# Patient Record
Sex: Male | Born: 1955 | Race: White | Hispanic: No | Marital: Single | Smoking: Former smoker
Health system: Southern US, Community
[De-identification: ages and names within clinical notes are randomized; demographics above are authoritative.]

## PROBLEM LIST (undated history)

## (undated) DIAGNOSIS — K648 Other hemorrhoids: Secondary | ICD-10-CM

## (undated) DIAGNOSIS — Z86010 Personal history of colonic polyps: Secondary | ICD-10-CM

## (undated) DIAGNOSIS — K219 Gastro-esophageal reflux disease without esophagitis: Secondary | ICD-10-CM

## (undated) DIAGNOSIS — I10 Essential (primary) hypertension: Secondary | ICD-10-CM

## (undated) DIAGNOSIS — N451 Epididymitis: Secondary | ICD-10-CM

## (undated) HISTORY — DX: Other hemorrhoids: K64.8

## (undated) HISTORY — DX: Personal history of colonic polyps: Z86.010

## (undated) HISTORY — DX: Gastro-esophageal reflux disease without esophagitis: K21.9

## (undated) HISTORY — PX: POLYPECTOMY: SHX149

## (undated) HISTORY — DX: Epididymitis: N45.1

## (undated) HISTORY — DX: Essential (primary) hypertension: I10

## (undated) HISTORY — PX: COLONOSCOPY: SHX174

---

## 2007-03-12 ENCOUNTER — Emergency Department (HOSPITAL_COMMUNITY): Admission: EM | Admit: 2007-03-12 | Discharge: 2007-03-12 | Payer: Self-pay | Admitting: Emergency Medicine

## 2008-07-21 ENCOUNTER — Emergency Department (HOSPITAL_COMMUNITY): Admission: EM | Admit: 2008-07-21 | Discharge: 2008-07-21 | Payer: Self-pay | Admitting: Emergency Medicine

## 2008-09-09 ENCOUNTER — Emergency Department (HOSPITAL_COMMUNITY): Admission: EM | Admit: 2008-09-09 | Discharge: 2008-09-09 | Payer: Self-pay | Admitting: Emergency Medicine

## 2008-10-23 ENCOUNTER — Ambulatory Visit (HOSPITAL_COMMUNITY): Admission: RE | Admit: 2008-10-23 | Discharge: 2008-10-23 | Payer: Self-pay | Admitting: *Deleted

## 2009-05-26 ENCOUNTER — Emergency Department (HOSPITAL_COMMUNITY): Admission: EM | Admit: 2009-05-26 | Discharge: 2009-05-26 | Payer: Self-pay | Admitting: Family Medicine

## 2011-03-24 LAB — POCT URINALYSIS DIP (DEVICE)
Glucose, UA: NEGATIVE mg/dL
Ketones, ur: NEGATIVE mg/dL
Specific Gravity, Urine: 1.015 (ref 1.005–1.030)
Urobilinogen, UA: 0.2 mg/dL (ref 0.0–1.0)

## 2011-04-29 NOTE — Op Note (Signed)
Dylan Barton, DUERSON NO.:  [id number]   MEDICAL RECORD NO.:  [id number]          PATIENT TYPE:  AMB   LOCATION:  ENDO                         FACILITY:  [facility]   PHYSICIAN:  Georgiana Spinner, M.D.    DATE OF BIRTH:  07-Nov-1956   DATE OF PROCEDURE:  DATE OF DISCHARGE:                               OPERATIVE REPORT   PROCEDURE:  Colonoscopy.   INDICATIONS:  Colon cancer screening.   ANESTHESIA:  Fentanyl 100 mcg, Versed 10 mg.   PROCEDURE:  With the patient mildly sedated in the left lateral  decubitus position, a rectal exam was performed which was unremarkable.  Subsequently the Pentax videoscopic colonoscope was inserted in the  rectum and passed under direct vision to the cecum, identified by  ileocecal valve and appendiceal orifice, both of which were  photographed.  From this point the colonoscope was slowly withdrawn  taking circumferential views of colonic mucosa stopping in the rectum,  which appeared normal on direct and retroflexed views.  The endoscope  was straightened and withdrawn.  The patient's vital signs and pulse  oximeter remained stable.  The patient tolerated the procedure well  without apparent complications.   FINDINGS:  Negative examination.   PLAN:  Consider repeat examination in 5-10 years.           ______________________________  Georgiana Spinner, M.D.     GMO/MEDQ  D:  10/23/2008  T:  10/23/2008  Job:  [id number]

## 2011-09-15 LAB — POCT URINALYSIS DIP (DEVICE)
Glucose, UA: NEGATIVE
Ketones, ur: NEGATIVE
Operator id: 270961
Specific Gravity, Urine: 1.025
Urobilinogen, UA: 0.2

## 2012-08-12 ENCOUNTER — Encounter (HOSPITAL_COMMUNITY): Payer: Self-pay | Admitting: Emergency Medicine

## 2012-08-12 ENCOUNTER — Emergency Department (HOSPITAL_COMMUNITY)
Admission: EM | Admit: 2012-08-12 | Discharge: 2012-08-12 | Disposition: A | Discharge status: Home / Self Care | Payer: BC Managed Care – PPO | Source: Home / Self Care | Attending: Family Medicine | Admitting: Family Medicine

## 2012-08-12 DIAGNOSIS — S0501XA Injury of conjunctiva and corneal abrasion without foreign body, right eye, initial encounter: Secondary | ICD-10-CM

## 2012-08-12 DIAGNOSIS — H5710 Ocular pain, unspecified eye: Secondary | ICD-10-CM

## 2012-08-12 DIAGNOSIS — H5711 Ocular pain, right eye: Secondary | ICD-10-CM

## 2012-08-12 DIAGNOSIS — S058X9A Other injuries of unspecified eye and orbit, initial encounter: Secondary | ICD-10-CM

## 2012-08-12 MED ORDER — TETRACAINE HCL 0.5 % OP SOLN
OPHTHALMIC | Status: AC
  Filled 2012-08-12: qty 2

## 2012-08-12 MED ORDER — POLYMYXIN B-TRIMETHOPRIM 10000-0.1 UNIT/ML-% OP SOLN: 1.0000 [drp] | OPHTHALMIC | Status: AC

## 2012-08-12 NOTE — ED Provider Notes (Signed)
History     CSN: [id number]  Arrival date & time 08/12/12  1654   First MD Initiated Contact with Patient 08/12/12 1732      Chief Complaint  Patient presents with  . Foreign Body in Eye    (Consider location/radiation/quality/duration/timing/severity/associated sxs/prior treatment) Patient is a 56 y.o. male presenting with foreign body in eye. The history is provided by the patient.  Foreign Body in Eye  Patient reports right eye graining feeling in for two days, states not sure if something flew in his eye since he was standing near a fan.  States has used artificial tears to flush with no improvement in symptoms.  Denies previous history of same.  Not diabetic, no previous eye surgery/trauma, denies cataracts or glaucoma. No floaters or flashing lights No vision loss No blurred vision + eye irritation No eyelid itching No tearing No headache/scalp tenderness Does not wear contacts or glasses.  History reviewed. No pertinent past medical history.  History reviewed. No pertinent past surgical history.  No family history on file.  History  Substance Use Topics  . Smoking status: Never Smoker   . Smokeless tobacco: Not on file  . Alcohol Use: Yes      Review of Systems  All other systems reviewed and are negative.    Allergies  Review of patient's allergies indicates no known allergies.  Home Medications   Current Outpatient Rx  Name Route Sig Dispense Refill  . POLYMYXIN B-TRIMETHOPRIM 10000-0.1 UNIT/ML-% OP SOLN Right Eye Place 1 drop into the right eye every 4 (four) hours. 10 mL 0    BP 166/88  Pulse 84  Temp 98.5 F (36.9 C) (Oral)  Resp 17  SpO2 98%  Physical Exam  Nursing note and vitals reviewed. Constitutional: He is oriented to person, place, and time. Vital signs are normal. He appears well-developed and well-nourished. He is active and cooperative.  HENT:  Head: Normocephalic.  Right Ear: Hearing, tympanic membrane, external ear and ear  canal normal.  Left Ear: Hearing, tympanic membrane, external ear and ear canal normal.  Nose: Nose normal. Right sinus exhibits no maxillary sinus tenderness and no frontal sinus tenderness. Left sinus exhibits no maxillary sinus tenderness and no frontal sinus tenderness.  Mouth/Throat: Uvula is midline, oropharynx is clear and moist and mucous membranes are normal.  Eyes: Conjunctivae, EOM and lids are normal. Pupils are equal, round, and reactive to light. No foreign bodies found. Right eye exhibits no chemosis, no discharge, no exudate and no hordeolum. No foreign body present in the right eye. Left eye exhibits no chemosis, no discharge, no exudate and no hordeolum. No foreign body present in the left eye. No scleral icterus.         Tetracaine and fluoroscien applied to right eye, examined with woods lamp.  right corneal abrasion noted, no foreign body found.  Pt tolerated procedure well.  Neck: Trachea normal. Neck supple.  Cardiovascular: Normal rate and regular rhythm.   Pulmonary/Chest: Effort normal and breath sounds normal.  Lymphadenopathy:    He has no cervical adenopathy.  Neurological: He is alert and oriented to person, place, and time. No cranial nerve deficit or sensory deficit.  Skin: Skin is warm and dry.  Psychiatric: He has a normal mood and affect. His speech is normal and behavior is normal. Judgment and thought content normal. Cognition and memory are normal.    ED Course  Procedures (including critical care time)  Labs Reviewed - No data to display No results  found.   1. Right corneal abrasion   2. Pain in right eye       MDM  Polytrim as ordered.  Apply warm compresses four times daily. Seek follow-up care with an ophthalmologist within 3-4 days to ensure resolution.           Johnsie Kindred, NP 08/12/12 2008

## 2012-08-12 NOTE — ED Notes (Addendum)
Pt here with right eye irritation and little swelling after poss object in eye.states after working on fan something may have flew in eye.eye drops used but denies blurred vision or dizziness.sx started 2 dys ago

## 2012-08-15 NOTE — ED Provider Notes (Signed)
Medical screening examination/treatment/procedure(s) were performed by resident physician or non-physician practitioner and as supervising physician I was immediately available for consultation/collaboration.   Barkley Bruns MD.    Linna Hoff, MD 08/15/12 [phone]

## 2013-08-29 ENCOUNTER — Emergency Department (HOSPITAL_COMMUNITY)
Admission: EM | Admit: 2013-08-29 | Discharge: 2013-08-29 | Disposition: A | Discharge status: Home / Self Care | Payer: BC Managed Care – PPO | Source: Home / Self Care | Attending: Family Medicine | Admitting: Family Medicine

## 2013-08-29 ENCOUNTER — Encounter (HOSPITAL_COMMUNITY): Payer: Self-pay

## 2013-08-29 DIAGNOSIS — IMO0002 Reserved for concepts with insufficient information to code with codable children: Secondary | ICD-10-CM

## 2013-08-29 DIAGNOSIS — S41109A Unspecified open wound of unspecified upper arm, initial encounter: Secondary | ICD-10-CM

## 2013-08-29 DIAGNOSIS — Z23 Encounter for immunization: Secondary | ICD-10-CM

## 2013-08-29 MED ORDER — TETANUS-DIPHTH-ACELL PERTUSSIS 5-2.5-18.5 LF-MCG/0.5 IM SUSP
0.5000 mL | Freq: Once | INTRAMUSCULAR | Status: AC
  Administered 2013-08-29: 0.5 mL via INTRAMUSCULAR

## 2013-08-29 MED ORDER — TETANUS-DIPHTH-ACELL PERTUSSIS 5-2.5-18.5 LF-MCG/0.5 IM SUSP
INTRAMUSCULAR | Status: AC
  Filled 2013-08-29: qty 0.5

## 2013-08-29 NOTE — ED Provider Notes (Signed)
CSN: [id number]     Arrival date & time 08/29/13  1156 History   None    Chief Complaint  Patient presents with  . 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 Wasn't concerned about wound, but came in to get a new tetanus shot.   Patient is a 57 y.o. male presenting with skin laceration. The history is provided by the patient.  Laceration Location:  Shoulder/arm Shoulder/arm laceration location:  L elbow Depth:  Through dermis Quality: avulsion   Time since incident:  13 hours Laceration mechanism:  Metal edge Pain details:    Quality:  Aching   Severity:  Mild   Progression:  Improving Foreign body present:  No foreign bodies Relieved by:  Pressure Worsened by:  Nothing tried Ineffective treatments:  None tried Tetanus status:  Out of date   History reviewed. No pertinent past medical history. History reviewed. No pertinent past surgical history. History reviewed. No pertinent family history. History  Substance Use Topics  . Smoking status: Never Smoker   . Smokeless tobacco: Not on file  . Alcohol Use: Yes    Review of Systems  Constitutional: Negative for fever and chills.  Skin: Positive for wound.    Allergies  Review of patient's allergies indicates no known allergies.  Home Medications  No current outpatient prescriptions on file. BP 164/87  Pulse 85  Temp(Src) 98 F (36.7 C) (Oral)  Resp 18  SpO2 100% Physical Exam  Constitutional: He appears well-developed and well-nourished. No distress.  Skin: Skin is warm and dry. Laceration noted.       ED Course  Procedures (including critical care time) Labs Review Labs Reviewed - No data to display Imaging Review No results found.  MDM   1. Laceration of elbow or forearm or wrist    Tetanus updated.     Cathlyn Parsons, NP 08/29/13 1348

## 2013-08-29 NOTE — ED Notes (Signed)
C/o he cut his left elbow on light fixture around midnight last night on job (self employed) V shaped flap laceration, no bleeding at present, NAD

## 2013-08-29 NOTE — Discharge Instructions (Signed)
Use antibiotic ointment on wound and keep clean and covered until healed.  The tape strips will fall off in about 4-5 days.

## 2013-08-30 NOTE — ED Provider Notes (Signed)
Medical screening examination/treatment/procedure(s) were performed by a resident physician or non-physician practitioner and as the supervising physician I was immediately available for consultation/collaboration.  Jaran Sainz, MD   Hassel Uphoff S Laverle Pillard, MD 08/30/13 1338 

## 2014-04-04 ENCOUNTER — Encounter: Payer: Self-pay | Admitting: Internal Medicine

## 2014-06-05 ENCOUNTER — Encounter: Payer: Self-pay | Admitting: Internal Medicine

## 2014-06-05 ENCOUNTER — Ambulatory Visit (INDEPENDENT_AMBULATORY_CARE_PROVIDER_SITE_OTHER): Payer: BC Managed Care – PPO | Admitting: Internal Medicine

## 2014-06-05 VITALS — BP 150/78 | HR 76 | Ht 73.0 in | Wt 161.0 lb

## 2014-06-05 DIAGNOSIS — N509 Disorder of male genital organs, unspecified: Secondary | ICD-10-CM

## 2014-06-05 DIAGNOSIS — Z1211 Encounter for screening for malignant neoplasm of colon: Secondary | ICD-10-CM

## 2014-06-05 DIAGNOSIS — K648 Other hemorrhoids: Secondary | ICD-10-CM

## 2014-06-05 DIAGNOSIS — N50811 Right testicular pain: Secondary | ICD-10-CM

## 2014-06-05 DIAGNOSIS — Z800 Family history of malignant neoplasm of digestive organs: Secondary | ICD-10-CM | POA: Insufficient documentation

## 2014-06-05 HISTORY — DX: Other hemorrhoids: K64.8

## 2014-06-05 NOTE — Patient Instructions (Signed)
You have been scheduled for a colonoscopy. Please follow written instructions given to you at your visit today.  Please pick up your prep kit at the pharmacy within the next 1-3 days. If you use inhalers (even only as needed), please bring them with you on the day of your procedure. Your physician has requested that you go to www.startemmi.com and enter the access code given to you at your visit today. This web site gives a general overview about your procedure. However, you should still follow specific instructions given to you by our office regarding your preparation for the procedure.  Today we have given you a handout to read on hemorrhoids.  Please go and see Dr. Shelia Media regarding your testicular issues.   I appreciate the opportunity to care for you.

## 2014-06-05 NOTE — Progress Notes (Signed)
         Subjective:    Patient ID: Dylan Barton, male    DOB: 02/06/1956, 58 y.o.   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 He had that resected. The patient had a colonoscopy in 2009 by Dr. Lajoyce Corners, it was normal.  Time in the past few months he had an episode of bright red blood in the toilet bowl. It was after a long drive. He travels a lot for work. Hemoglobin was normal at 13.7.  He briefly used Anusol HC cream. He has not had recurrent bleeding or any rectal or anal symptoms. He has rare heartburn says he is very flatulent but otherwise unremarkable GI review of systems. No Known Allergies No outpatient prescriptions prior to visit.   No facility-administered medications prior to visit.   Past Medical History  Diagnosis Date  . Epididymitis    Past Surgical History  Procedure Laterality Date  . Colonoscopy     History   Social History  . Marital Status: Single   Occupational History  . Electrician    Social History Main Topics  . Smoking status: Former Research scientist (life sciences)  . Smokeless tobacco: Never Used  . Alcohol Use: Yes  . Drug Use: No   Social History Narrative   Single, no children   He works as an Clinical biochemist traveling to [employer]    One alcoholic drink per day 2 caffeinated beverages daily   Family History  Problem Relation Age of Onset  . Colon cancer Brother   . Colon polyps Brother   . CVA Mother 61        Review of Systems The burning pain in the right testicle without any change in sexual function, urination. No mass.    Objective:   Physical Exam General:  Well-developed, well-nourished and in no acute distress Eyes:  anicteric. ENT:   Mouth and posterior pharynx free of lesions.  Neck:   supple w/o thyromegaly or mass.  Lungs: Clear to auscultation bilaterally. Heart:  S1S2, no rubs, murmurs, gallops. Abdomen:  soft,  non-tender, no hepatosplenomegaly, hernia, or mass and BS+.  Rectal: Normal anoderm, no mass, prostate ok, brown stool GU:  Normal circumcised penis, bilateral descended testes that are normal, there are no hernias, there is no tenderness no scrotal change. Lymph:  no cervical or supraclavicular adenopathy. Extremities:   no edema Skin   no rash. Neuro:  A&O x 3.  Psych:  appropriate mood and  Affect.   Anoscopy was performed with the patient in the left lateral decubitus position  and revealed Grade 1 internal hemorrhoids all 3 positions.  Data Reviewed:  Prior colonoscopy 2009 NL CBC 04/03/2014 PCP note 04/03/2014    Assessment & Plan:   1. Hemorrhoids, internal, with bleeding  Handout provided one-time occurrence, no other intervention needed at this time   2. Colon cancer screening   3. Family history of colon cancer - brother 57   Will schedule screening colonoscopy.  The risks and benefits as well as alternatives of endoscopic procedure(s) have been discussed and reviewed. All questions answered. The patient agrees to proceed.   4. Right testicular pain   I find no abnormality to correlate. He will followup with Dr. Shelia Media.   I appreciate the opportunity to care for this patient.  CC: Horatio Pel, MD

## 2014-06-09 ENCOUNTER — Encounter: Payer: Self-pay | Admitting: Internal Medicine

## 2014-08-07 ENCOUNTER — Ambulatory Visit (AMBULATORY_SURGERY_CENTER): Payer: BC Managed Care – PPO | Admitting: Internal Medicine

## 2014-08-07 ENCOUNTER — Encounter: Payer: Self-pay | Admitting: Internal Medicine

## 2014-08-07 VITALS — BP 125/65 | HR 67 | Temp 98.9°F | Resp 18 | Ht 73.0 in | Wt 161.0 lb

## 2014-08-07 DIAGNOSIS — Z860100 Personal history of colon polyps, unspecified: Secondary | ICD-10-CM

## 2014-08-07 DIAGNOSIS — Z800 Family history of malignant neoplasm of digestive organs: Secondary | ICD-10-CM

## 2014-08-07 DIAGNOSIS — D126 Benign neoplasm of colon, unspecified: Secondary | ICD-10-CM

## 2014-08-07 DIAGNOSIS — Z1211 Encounter for screening for malignant neoplasm of colon: Secondary | ICD-10-CM

## 2014-08-07 HISTORY — DX: Personal history of colon polyps, unspecified: Z86.0100

## 2014-08-07 HISTORY — DX: Personal history of colonic polyps: Z86.010

## 2014-08-07 MED ORDER — SODIUM CHLORIDE 0.9 % IV SOLN: 500.0000 mL | INTRAVENOUS | Status: DC

## 2014-08-07 NOTE — Progress Notes (Signed)
No problems noted in the recovery room. maw 

## 2014-08-07 NOTE — Progress Notes (Signed)
Called to room to assist during endoscopic procedure.  Patient ID and intended procedure confirmed with present staff. Received instructions for my participation in the procedure from the performing physician.  

## 2014-08-07 NOTE — Progress Notes (Signed)
A/ox3 pleased with MAC, report to Annette RN 

## 2014-08-07 NOTE — Op Note (Signed)
[place]  [address] [place], [address]   COLONOSCOPY PROCEDURE REPORT  PATIENT: Dylan, Barton  MR#: [id number] BIRTHDATE: July 28, 1956 , 89  yrs. old GENDER: Male ENDOSCOPIST: Gatha Mayer, MD, [facility] REFERRED AO:ZHYQMV Delight Hoh, M.D. PROCEDURE DATE:  08/07/2014 PROCEDURE:   Colonoscopy with biopsy and snare polypectomy and Submucosal injection, any substance First Screening Colonoscopy - Avg.  risk and is 50 yrs.  old or older - No.  Prior Negative Screening - Now for repeat screening. Above average risk  History of Adenoma - Now for follow-up colonoscopy & has been > or = to 3 yrs.  N/A  Polyps Removed Today? Yes. ASA CLASS:   Class I INDICATIONS:Patient's immediate family history of colon cancer (brother - 45), elevated risk screening, and Last colonoscopy performed 5 years ago. MEDICATIONS: propofol (Diprivan) 350mg  IV, MAC sedation, administered by CRNA, and These medications were titrated to patient response per physician's verbal order  DESCRIPTION OF PROCEDURE:   After the risks benefits and alternatives of the procedure were thoroughly explained, informed consent was obtained.  A digital rectal exam revealed no abnormalities of the rectum, A digital rectal exam revealed the prostate was not enlarged, and A digital rectal exam revealed no prostatic nodules.   The LB [id number]-H190 [id number]  endoscope was introduced through the anus and advanced to the cecum, which was identified by both the appendix and ileocecal valve. No adverse events experienced.   The quality of the prep was good, using MiraLax  The instrument was then slowly withdrawn as the colon was fully examined.      COLON FINDINGS: A polypoid shaped flat polyp measuring 12-15 mm in size was found at the cecum.  Endoscopic mucosal resection was performed in a piecemeal fashion by injecting saline into the submucosa to raise the lesion and polypectomy was performed with snare cautery.  The  polyp lifted well after submucosal injection. The resection was complete and the polyp tissue was completely retrieved.  There was minimal blood loss from the polypectomy which ceased spontaneously. 1 clip placed to reduce risk future bleeding. Three diminutive sessile polyps were found in the ascending colon, transverse colon, and at the splenic flexure.  A polypectomy was performed with cold forceps and with a cold snare.  The resection was complete and the polyp tissue was partially retrieved.  Splenic flexure polyp not retrieved.  The colon mucosa was otherwise normal.   A right colon retroflexion was performed.  Retroflexed views revealed internal hemorrhoids. The time to cecum=3 minutes 38 seconds.  Withdrawal time=20 minutes 07 seconds.  The scope was withdrawn and the procedure completed. COMPLICATIONS: There were no complications.  ENDOSCOPIC IMPRESSION: 1.   Flat polyp measuring 12-15 mm in size was found at the cecum; endoscopic mucosal resection was performed 2.   Three diminutive sessile polyps were found in the ascending colon, transverse colon, and at the splenic flexure; polypectomy was performed with cold forceps and with a cold snare 3.   The colon mucosa was otherwise normal - good prep  RECOMMENDATIONS: 1.  Timing of repeat colonoscopy will be determined by pathology findings. 2.  Hold aspirin, aspirin products, and anti-inflammatory medication for 2 weeks.   eSigned:  Gatha Mayer, MD, [facility] 08/07/2014 2:46 PM   cc: Alden Server, MD and The Patient   PATIENT NAME:  Dylan, Barton MR#: [id number]

## 2014-08-07 NOTE — Patient Instructions (Addendum)
I found and removed 4 polyps - one of them could not be picked up for analysis.  The largest polyp was flat and may need close follow-up in 6-12 months to make sure its gone.  I will let you know pathology results and when to have another routine colonoscopy by mail.  I appreciate the opportunity to care for you. Gatha Mayer, MD, FACG    YOU HAD AN ENDOSCOPIC PROCEDURE TODAY AT [facility]: Refer to the procedure report that was given to you for any specific questions about what was found during the examination.  If the procedure report does not answer your questions, please call your gastroenterologist to clarify.  If you requested that your care partner not be given the details of your procedure findings, then the procedure report has been included in a sealed envelope for you to review at your convenience later.  YOU SHOULD EXPECT: Some feelings of bloating in the abdomen. Passage of more gas than usual.  Walking can help get rid of the air that was put into your GI tract during the procedure and reduce the bloating. If you had a lower endoscopy (such as a colonoscopy or flexible sigmoidoscopy) you may notice spotting of blood in your stool or on the toilet paper. If you underwent a bowel prep for your procedure, then you may not have a normal bowel movement for a few days.  DIET: Your first meal following the procedure should be a light meal and then it is ok to progress to your normal diet.  A half-sandwich or bowl of soup is an example of a good first meal.  Heavy or fried foods are harder to digest and may make you feel nauseous or bloated.  Likewise meals heavy in dairy and vegetables can cause extra gas to form and this can also increase the bloating.  Drink plenty of fluids but you should avoid alcoholic beverages for 24 hours.  ACTIVITY: Your care partner should take you home directly after the procedure.  You should plan to take it easy, moving slowly for the rest of  the day.  You can resume normal activity the day after the procedure however you should NOT DRIVE or use heavy machinery for 24 hours (because of the sedation medicines used during the test).    SYMPTOMS TO REPORT IMMEDIATELY: A gastroenterologist can be reached at any hour.  During normal business hours, 8:30 AM to 5:00 PM Monday through Friday, call [phone].  After hours and on weekends, please call the GI answering service at [phone] who will take a message and have the physician on call contact you.   Following lower endoscopy (colonoscopy or flexible sigmoidoscopy):  Excessive amounts of blood in the stool  Significant tenderness or worsening of abdominal pains  Swelling of the abdomen that is new, acute  Fever of 100F or higher FOLLOW UP: If any biopsies were taken you will be contacted by phone or by letter within the next 1-3 weeks.  Call your gastroenterologist if you have not heard about the biopsies in 3 weeks.  Our staff will call the home number listed on your records the next business day following your procedure to check on you and address any questions or concerns that you may have at that time regarding the information given to you following your procedure. This is a courtesy call and so if there is no answer at the home number and we have not heard from you through  the emergency physician on call, we will assume that you have returned to your regular daily activities without incident.  SIGNATURES/CONFIDENTIALITY: You and/or your care partner have signed paperwork which will be entered into your electronic medical record.  These signatures attest to the fact that that the information above on your After Visit Summary has been reviewed and is understood.  Full responsibility of the confidentiality of this discharge information lies with you and/or your care-partner.     Handouts were given to your care partner on polyps and hemorrhoids. Hold aspirin, aspirin  products, and any anti-inflammatory medications for two weeks. You may resume your other current medications today. Await biopsy results. Please call if any questions or concerns. A clip was placed in the cecum to aid in stopping bleeding.  Card given to your care partner about clip placement.

## 2014-08-08 ENCOUNTER — Telehealth: Payer: Self-pay | Admitting: *Deleted

## 2014-08-08 NOTE — Telephone Encounter (Signed)
  Follow up Call-  Call back number 08/07/2014  Post procedure Call Back phone  # [phone]  Permission to leave phone message Yes     Patient questions:  Do you have a fever, pain , or abdominal swelling? No. Pain Score  0 *  Have you tolerated food without any problems? Yes.    Have you been able to return to your normal activities? Yes.    Do you have any questions about your discharge instructions: Diet   No. Medications  No. Follow up visit  No.  Do you have questions or concerns about your Care? Yes.    Actions: * If pain score is 4 or above: No action needed, pain <4.

## 2014-08-14 ENCOUNTER — Encounter: Payer: Self-pay | Admitting: Internal Medicine

## 2014-08-14 NOTE — Progress Notes (Signed)
Quick Note:  3 adenomas max 15 mm Repeat colonoscopy 2018 ______

## 2017-09-02 ENCOUNTER — Encounter: Payer: Self-pay | Admitting: Internal Medicine

## 2017-09-28 ENCOUNTER — Encounter: Payer: Self-pay | Admitting: Internal Medicine

## 2017-10-28 ENCOUNTER — Other Ambulatory Visit: Payer: Self-pay

## 2017-10-28 ENCOUNTER — Ambulatory Visit (AMBULATORY_SURGERY_CENTER): Payer: Self-pay | Admitting: *Deleted

## 2017-10-28 VITALS — Ht 73.0 in | Wt 167.2 lb

## 2017-10-28 DIAGNOSIS — Z86010 Personal history of colonic polyps: Secondary | ICD-10-CM

## 2017-10-28 NOTE — Progress Notes (Signed)
No allergies to eggs or soy. No prior anesthesia; no problems with sedation with last colonoscopy  Pt given Emmi instructions for colonoscopy  No oxygen use  No diet drug use

## 2017-10-29 ENCOUNTER — Encounter: Payer: Self-pay | Admitting: Internal Medicine

## 2017-11-11 ENCOUNTER — Ambulatory Visit (AMBULATORY_SURGERY_CENTER): Payer: BLUE CROSS/BLUE SHIELD | Admitting: Internal Medicine

## 2017-11-11 ENCOUNTER — Encounter: Payer: Self-pay | Admitting: Internal Medicine

## 2017-11-11 ENCOUNTER — Other Ambulatory Visit: Payer: Self-pay

## 2017-11-11 VITALS — BP 135/84 | HR 62 | Temp 97.5°F | Resp 39 | Ht 73.0 in | Wt 167.0 lb

## 2017-11-11 DIAGNOSIS — Z800 Family history of malignant neoplasm of digestive organs: Secondary | ICD-10-CM

## 2017-11-11 DIAGNOSIS — Z86010 Personal history of colonic polyps: Secondary | ICD-10-CM | POA: Diagnosis present

## 2017-11-11 DIAGNOSIS — D129 Benign neoplasm of anus and anal canal: Secondary | ICD-10-CM

## 2017-11-11 DIAGNOSIS — D120 Benign neoplasm of cecum: Secondary | ICD-10-CM

## 2017-11-11 DIAGNOSIS — D124 Benign neoplasm of descending colon: Secondary | ICD-10-CM

## 2017-11-11 DIAGNOSIS — D128 Benign neoplasm of rectum: Secondary | ICD-10-CM | POA: Diagnosis not present

## 2017-11-11 MED ORDER — SODIUM CHLORIDE 0.9 % IV SOLN: 500.0000 mL | INTRAVENOUS | Status: DC

## 2017-11-11 NOTE — Progress Notes (Signed)
Called to room to assist during endoscopic procedure.  Patient ID and intended procedure confirmed with present staff. Received instructions for my participation in the procedure from the performing physician.Called to room to assist during endoscopic procedure.   

## 2017-11-11 NOTE — Op Note (Signed)
Dylan Barton: Dylan Barton Procedure Date: 11/11/2017 12:08 PM MRN: [id number] Endoscopist: Gatha Mayer , MD Age: 61 Referring MD:  Date of Birth: [birth year] Gender: Male Account #: [id number] Procedure:                Colonoscopy Indications:              Surveillance: Personal history of adenomatous                            polyps on last colonoscopy 3 years ago, Family                            history of colon cancer in a first-degree relative Medicines:                Propofol per Anesthesia, Monitored Anesthesia Care Procedure:                Pre-Anesthesia Assessment:                           - Prior to the procedure, a History and Physical                            was performed, and patient medications and                            allergies were reviewed. The patient's tolerance of                            previous anesthesia was also reviewed. The risks                            and benefits of the procedure and the sedation                            options and risks were discussed with the patient.                            All questions were answered, and informed consent                            was obtained. Prior Anticoagulants: The patient has                            taken no previous anticoagulant or antiplatelet                            agents. ASA Grade Assessment: I - A normal, healthy                            patient. After reviewing the risks and benefits,                            the patient was deemed in satisfactory condition to  undergo the procedure.                           After obtaining informed consent, the colonoscope                            was passed under direct vision. Throughout the                            procedure, the patient's blood pressure, pulse, and                            oxygen saturations were monitored continuously. The   Colonoscope was introduced through the anus and                            advanced to the the cecum, identified by                            appendiceal orifice and ileocecal valve. The                            colonoscopy was performed without difficulty. The                            patient tolerated the procedure well. The quality                            of the bowel preparation was good. The bowel                            preparation used was Miralax. The ileocecal valve,                            appendiceal orifice, and rectum were photographed. Scope In: 12:20:22 PM Scope Out: 12:40:09 PM Scope Withdrawal Time: 0 hours 17 minutes 11 seconds  Total Procedure Duration: 0 hours 19 minutes 47 seconds  Findings:                 The perianal and digital rectal examinations were                            normal. Pertinent negatives include normal prostate                            (size, shape, and consistency).                           A 8 mm polyp was found in the cecum. The polyp was                            sessile. Arising near/from polypectomy scar.The                            polyp was removed with a hot  snare. Resection and                            retrieval were complete. Verification of patient                            identification for the specimen was done. Estimated                            blood loss was minimal.                           Two sessile polyps were found in the rectum and                            descending colon. The polyps were diminutive in                            size. These polyps were removed with a cold snare.                            Resection and retrieval were complete. Verification                            of patient identification for the specimen was                            done. Estimated blood loss was minimal.                           Multiple diverticula were found in the sigmoid                             colon.                           The exam was otherwise without abnormality on                            direct and retroflexion views. Complications:            No immediate complications. Estimated Blood Loss:     Estimated blood loss was minimal. Impression:               - One 8 mm polyp in the cecum, removed with a hot                            snare. Resected and retrieved. Appears to be                            recurrent polyp at site of prior polypectomy.                           - Two diminutive polyps in the rectum and in the  descending colon, removed with a cold snare.                            Resected and retrieved.                           - Diverticulosis in the sigmoid colon.                           - The examination was otherwise normal on direct                            and retroflexion views.                           - Personal history of colonic polyps and FHx CRCA                            brother dx age 10 Recommendation:           - Patient has a contact number available for                            emergencies. The signs and symptoms of potential                            delayed complications were discussed with the                            patient. Return to normal activities tomorrow.                            Written discharge instructions were provided to the                            patient.                           - Resume previous diet.                           - Continue present medications.                           - No aspirin, ibuprofen, naproxen, or other                            non-steroidal anti-inflammatory drugs for 2 weeks                            after polyp removal.                           - Repeat colonoscopy is recommended for                            surveillance. The colonoscopy date will  be                            determined after pathology results from today's                             exam become available for review. Gatha Mayer, MD 11/11/2017 12:49:59 PM This report has been signed electronically.

## 2017-11-11 NOTE — Patient Instructions (Addendum)
   I removed 3 polyps today - one appeared to be growing back after removal 3 years ago. All look benign.  I will let you know pathology results and when to have another routine colonoscopy by mail and/or My Chart.  I appreciate the opportunity to care for you. Gatha Mayer, MD, [facility]   **handout given on polyps & diverticulosis**   YOU HAD AN ENDOSCOPIC PROCEDURE TODAY: Refer to the procedure report and other information in the discharge instructions given to you for any specific questions about what was found during the examination. If this information does not answer your questions, please call [place] office at [phone] to clarify.   YOU SHOULD EXPECT: Some feelings of bloating in the abdomen. Passage of more gas than usual. Walking can help get rid of the air that was put into your GI tract during the procedure and reduce the bloating. If you had a lower endoscopy (such as a colonoscopy or flexible sigmoidoscopy) you may notice spotting of blood in your stool or on the toilet paper. Some abdominal soreness may be present for a day or two, also.  DIET: Your first meal following the procedure should be a light meal and then it is ok to progress to your normal diet. A half-sandwich or bowl of soup is an example of a good first meal. Heavy or fried foods are harder to digest and may make you feel nauseous or bloated. Drink plenty of fluids but you should avoid alcoholic beverages for 24 hours. If you had a esophageal dilation, please see attached instructions for diet.    ACTIVITY: Your care partner should take you home directly after the procedure. You should plan to take it easy, moving slowly for the rest of the day. You can resume normal activity the day after the procedure however YOU SHOULD NOT DRIVE, use power tools, machinery or perform tasks that involve climbing or major physical exertion for 24 hours (because of the sedation medicines used during the test).   SYMPTOMS TO  REPORT IMMEDIATELY: A gastroenterologist can be reached at any hour. Please call [phone]  for any of the following symptoms:  Following lower endoscopy (colonoscopy, flexible sigmoidoscopy) Excessive amounts of blood in the stool  Significant tenderness, worsening of abdominal pains  Swelling of the abdomen that is new, acute  Fever of 100 or higher    FOLLOW UP:  If any biopsies were taken you will be contacted by phone or by letter within the next 1-3 weeks. Call [phone]  if you have not heard about the biopsies in 3 weeks.  Please also call with any specific questions about appointments or follow up tests.

## 2017-11-11 NOTE — Progress Notes (Signed)
Called to room to assist during endoscopic procedure.  Patient ID and intended procedure confirmed with present staff. Received instructions for my participation in the procedure from the performing physician.  

## 2017-11-11 NOTE — Progress Notes (Signed)
Pt's states no medical or surgical changes since previsit or office visit. 

## 2017-11-12 ENCOUNTER — Telehealth: Payer: Self-pay | Admitting: *Deleted

## 2017-11-12 NOTE — Telephone Encounter (Signed)
  Follow up Call-  Call back number 11/11/2017  Post procedure Call Back phone  # [phone]  Permission to leave phone message Yes  Some recent data might be hidden     Patient questions:  Do you have a fever, pain , or abdominal swelling? No. Pain Score  0 *  Have you tolerated food without any problems? Yes.    Have you been able to return to your normal activities? Yes.    Do you have any questions about your discharge instructions: Diet   No. Medications  No. Follow up visit  No.  Do you have questions or concerns about your Care? No.  Actions: * If pain score is 4 or above: No action needed, pain <4.

## 2017-11-16 ENCOUNTER — Encounter: Payer: Self-pay | Admitting: Internal Medicine

## 2017-11-16 DIAGNOSIS — Z86010 Personal history of colonic polyps: Secondary | ICD-10-CM

## 2017-11-16 NOTE — Progress Notes (Signed)
Correction recall 2019 - one polyp was residula at prior polypectomy site

## 2017-11-16 NOTE — Progress Notes (Signed)
4 adenomas recall 3 years 2021

## 2019-01-18 DIAGNOSIS — Z0000 Encounter for general adult medical examination without abnormal findings: Secondary | ICD-10-CM | POA: Diagnosis not present

## 2019-01-18 DIAGNOSIS — Z125 Encounter for screening for malignant neoplasm of prostate: Secondary | ICD-10-CM | POA: Diagnosis not present

## 2019-01-18 DIAGNOSIS — Z1159 Encounter for screening for other viral diseases: Secondary | ICD-10-CM | POA: Diagnosis not present

## 2019-01-21 DIAGNOSIS — Z23 Encounter for immunization: Secondary | ICD-10-CM | POA: Diagnosis not present

## 2019-01-21 DIAGNOSIS — Z800 Family history of malignant neoplasm of digestive organs: Secondary | ICD-10-CM | POA: Diagnosis not present

## 2019-01-21 DIAGNOSIS — Z0000 Encounter for general adult medical examination without abnormal findings: Secondary | ICD-10-CM | POA: Diagnosis not present

## 2019-01-21 DIAGNOSIS — N529 Male erectile dysfunction, unspecified: Secondary | ICD-10-CM | POA: Diagnosis not present

## 2019-01-21 DIAGNOSIS — Z1212 Encounter for screening for malignant neoplasm of rectum: Secondary | ICD-10-CM | POA: Diagnosis not present

## 2019-02-17 ENCOUNTER — Encounter: Payer: Self-pay | Admitting: Internal Medicine

## 2019-04-15 DIAGNOSIS — Z23 Encounter for immunization: Secondary | ICD-10-CM | POA: Diagnosis not present

## 2021-01-16 ENCOUNTER — Encounter: Payer: Self-pay | Admitting: Internal Medicine

## 2021-01-28 ENCOUNTER — Ambulatory Visit (AMBULATORY_SURGERY_CENTER): Payer: Self-pay | Admitting: *Deleted

## 2021-01-28 ENCOUNTER — Other Ambulatory Visit: Payer: Self-pay

## 2021-01-28 VITALS — Ht 73.0 in | Wt 161.0 lb

## 2021-01-28 DIAGNOSIS — Z86010 Personal history of colonic polyps: Secondary | ICD-10-CM

## 2021-01-28 DIAGNOSIS — Z800 Family history of malignant neoplasm of digestive organs: Secondary | ICD-10-CM

## 2021-01-28 NOTE — Progress Notes (Signed)
No egg or soy allergy known to patient  No issues with past sedation with any surgeries or procedures No intubation problems in the past  No FH of Malignant Hyperthermia No diet pills per patient No home 02 use per patient  No blood thinners per patient  Pt denies issues with constipation  No A fib or A flutter  EMMI video to pt or via [place] 19 guidelines implemented in [place] today with Pt and RN  Pt is fully vaccinated  for Covid   Pt denies loose or missing teeth, denies dentures, partials, dental implants,  Pt has capped or crowns but no  bonded teeth    Due to the COVID-19 pandemic we are asking patients to follow certain guidelines.  Pt aware of COVID protocols and [facility] guidelines

## 2021-02-13 ENCOUNTER — Encounter: Payer: Self-pay | Admitting: Internal Medicine

## 2021-02-20 ENCOUNTER — Ambulatory Visit (AMBULATORY_SURGERY_CENTER): Payer: 59 | Admitting: Internal Medicine

## 2021-02-20 ENCOUNTER — Encounter: Payer: Self-pay | Admitting: Internal Medicine

## 2021-02-20 ENCOUNTER — Other Ambulatory Visit: Payer: Self-pay

## 2021-02-20 VITALS — BP 115/55 | HR 58 | Temp 98.6°F | Resp 14 | Ht 73.0 in | Wt 161.0 lb

## 2021-02-20 DIAGNOSIS — Z860100 Personal history of colon polyps, unspecified: Secondary | ICD-10-CM

## 2021-02-20 DIAGNOSIS — D123 Benign neoplasm of transverse colon: Secondary | ICD-10-CM

## 2021-02-20 MED ORDER — SODIUM CHLORIDE 0.9 % IV SOLN: 500.0000 mL | Freq: Once | INTRAVENOUS | Status: DC

## 2021-02-20 NOTE — Progress Notes (Signed)
Vitals are stable at this time.  Patient will not wake up even with sisters suggestions.  Dr Carlean Purl is aware.

## 2021-02-20 NOTE — Progress Notes (Unsigned)
To [facility], Vss. Report to [email or web address]

## 2021-02-20 NOTE — Op Note (Signed)
[place] Patient Name: Elvis Laufer Procedure Date: 02/20/2021 2:52 PM MRN: [id number] Endoscopist: Gatha Mayer , MD Age: 65 Referring MD:  Date of Birth: 06-11-1956 Gender: Male Account #: [id number] Procedure:                Colonoscopy Indications:              Surveillance: Personal history of adenomatous                            polyps on last colonoscopy > 3 years ago Medicines:                Propofol per Anesthesia, Monitored Anesthesia Care Procedure:                Pre-Anesthesia Assessment:                           - Prior to the procedure, a History and Physical                            was performed, and patient medications and                            allergies were reviewed. The patient's tolerance of                            previous anesthesia was also reviewed. The risks                            and benefits of the procedure and the sedation                            options and risks were discussed with the patient.                            All questions were answered, and informed consent                            was obtained. Prior Anticoagulants: The patient has                            taken no previous anticoagulant or antiplatelet                            agents. ASA Grade Assessment: II - A patient with                            mild systemic disease. After reviewing the risks                            and benefits, the patient was deemed in                            satisfactory condition to undergo the procedure.  After obtaining informed consent, the colonoscope                            was passed under direct vision. Throughout the                            procedure, the patient's blood pressure, pulse, and                            oxygen saturations were monitored continuously. The                            Olympus CF-HQ190L (Serial# [id number]) Colonoscope was                             introduced through the anus and advanced to the the                            cecum, identified by appendiceal orifice and                            ileocecal valve. The colonoscopy was performed                            without difficulty. The patient tolerated the                            procedure well. The quality of the bowel                            preparation was good. The ileocecal valve,                            appendiceal orifice, and rectum were photographed.                            The bowel preparation used was Miralax via split                            dose instruction. Scope In: 3:21:18 PM Scope Out: 3:32:27 PM Scope Withdrawal Time: 0 hours 8 minutes 15 seconds  Total Procedure Duration: 0 hours 11 minutes 9 seconds  Findings:                 The perianal and digital rectal examinations were                            normal. Pertinent negatives include normal prostate                            (size, shape, and consistency).                           A diminutive polyp was found in the transverse  colon. The polyp was sessile. The polyp was removed                            with a cold snare. Resection and retrieval were                            complete. Verification of patient identification                            for the specimen was done. Estimated blood loss was                            minimal.                           Multiple diverticula were found in the entire colon.                           The exam was otherwise without abnormality on                            direct and retroflexion views. Complications:            No immediate complications. Estimated Blood Loss:     Estimated blood loss was minimal. Impression:               - One diminutive polyp in the transverse colon,                            removed with a cold snare. Resected and retrieved.                           - Diverticulosis in the  entire examined colon.                           - The examination was otherwise normal on direct                            and retroflexion views.                           - Personal history of colonic polyps + Family Hx                            CRCA brother age 60                           08/07/2014 12-15 mm flat cecal polyp and 3                            diminutive right colon polyps (1 not recovered) -                            adenomas  11/11/2017 8 mm cecal (? Residual) and 2 diminutive                            polyps descending and rectum - adenomas Recommendation:           - Patient has a contact number available for                            emergencies. The signs and symptoms of potential                            delayed complications were discussed with the                            patient. Return to normal activities tomorrow.                            Written discharge instructions were provided to the                            patient.                           - Resume previous diet.                           - Continue present medications.                           - Repeat colonoscopy in 5 years for surveillance.                           - Await pathology results. Gatha Mayer, MD 02/20/2021 3:38:46 PM This report has been signed electronically.

## 2021-02-20 NOTE — Progress Notes (Signed)
Pt's states no medical or surgical changes since previsit or office visit.  VS taken by MO

## 2021-02-20 NOTE — Progress Notes (Unsigned)
Called to room to assist during endoscopic procedure.  Patient ID and intended procedure confirmed with present staff. Received instructions for my participation in the procedure from the performing physician.  

## 2021-02-20 NOTE — Patient Instructions (Addendum)
One tiny polyp removed today.I am certain it is benign but will let you know. Repeat colonoscopy should be in 5 years.  You also have a condition called diverticulosis - common and not usually a problem. Please read the handout provided.   I appreciate the opportunity to care for you. Gatha Mayer, MD, [facility]  Read all of the handouts given to you by your recovery room nurse.  YOU HAD AN ENDOSCOPIC PROCEDURE TODAY AT [facility]:   Refer to the procedure report that was given to you for any specific questions about what was found during the examination.  If the procedure report does not answer your questions, please call your gastroenterologist to clarify.  If you requested that your care partner not be given the details of your procedure findings, then the procedure report has been included in a sealed envelope for you to review at your convenience later.  YOU SHOULD EXPECT: Some feelings of bloating in the abdomen. Passage of more gas than usual.  Walking can help get rid of the air that was put into your GI tract during the procedure and reduce the bloating. If you had a lower endoscopy (such as a colonoscopy or flexible sigmoidoscopy) you may notice spotting of blood in your stool or on the toilet paper. If you underwent a bowel prep for your procedure, you may not have a normal bowel movement for a few days.  Please Note:  You might notice some irritation and congestion in your nose or some drainage.  This is from the oxygen used during your procedure.  There is no need for concern and it should clear up in a day or so.  SYMPTOMS TO REPORT IMMEDIATELY:   Following lower endoscopy (colonoscopy or flexible sigmoidoscopy):  Excessive amounts of blood in the stool  Significant tenderness or worsening of abdominal pains  Swelling of the abdomen that is new, acute  Fever of 100F or higher   For urgent or emergent issues, a gastroenterologist can be reached at any hour by  calling [phone]. Do not use MyChart messaging for urgent concerns.    DIET:  We do recommend a small meal at first, but then you may proceed to your regular diet.  Drink plenty of fluids but you should avoid alcoholic beverages for 24 hours.  ACTIVITY:  You should plan to take it easy for the rest of today and you should NOT DRIVE or use heavy machinery until tomorrow (because of the sedation medicines used during the test).    FOLLOW UP: Our staff will call the number listed on your records 48-72 hours following your procedure to check on you and address any questions or concerns that you may have regarding the information given to you following your procedure. If we do not reach you, we will leave a message.  We will attempt to reach you two times.  During this call, we will ask if you have developed any symptoms of COVID 19. If you develop any symptoms (ie: fever, flu-like symptoms, shortness of breath, cough etc.) before then, please call [phone].  If you test positive for Covid 19 in the 2 weeks post procedure, please call and report this information to [place].    If any biopsies were taken you will be contacted by phone or by letter within the next 1-3 weeks.  Please call us at [phone] if you have not heard about the biopsies in 3 weeks.    SIGNATURES/CONFIDENTIALITY: You and/or  your care partner have signed paperwork which will be entered into your electronic medical record.  These signatures attest to the fact that that the information above on your After Visit Summary has been reviewed and is understood.  Full responsibility of the confidentiality of this discharge information lies with you and/or your care-partner.

## 2021-02-22 ENCOUNTER — Telehealth: Payer: Self-pay

## 2021-02-22 NOTE — Telephone Encounter (Signed)
  Follow up Call-  Call back number 02/20/2021  Post procedure Call Back phone  # [phone]  Permission to leave phone message Yes  Some recent data might be hidden     Patient questions:  Do you have a fever, pain , or abdominal swelling? No. Pain Score  0 *  Have you tolerated food without any problems? Yes.    Have you been able to return to your normal activities? Yes.    Do you have any questions about your discharge instructions: Diet   No. Medications  No. Follow up visit  No.  Do you have questions or concerns about your Care? No.  Actions: * If pain score is 4 or above: No action needed, pain <4.

## 2021-02-27 ENCOUNTER — Encounter: Payer: Self-pay | Admitting: Internal Medicine

## 2021-02-27 DIAGNOSIS — Z86010 Personal history of colonic polyps: Secondary | ICD-10-CM

## 2021-02-28 ENCOUNTER — Other Ambulatory Visit (HOSPITAL_COMMUNITY): Payer: Self-pay | Admitting: Otolaryngology

## 2021-02-28 ENCOUNTER — Other Ambulatory Visit: Payer: Self-pay | Admitting: Otolaryngology

## 2021-02-28 DIAGNOSIS — R1312 Dysphagia, oropharyngeal phase: Secondary | ICD-10-CM

## 2021-03-12 ENCOUNTER — Other Ambulatory Visit: Payer: Self-pay

## 2021-03-12 ENCOUNTER — Ambulatory Visit (HOSPITAL_COMMUNITY)
Admission: RE | Admit: 2021-03-12 | Discharge: 2021-03-12 | Disposition: A | Discharge status: Home / Self Care | Payer: 59 | Source: Ambulatory Visit | Attending: Otolaryngology | Admitting: Otolaryngology

## 2021-03-12 DIAGNOSIS — R1312 Dysphagia, oropharyngeal phase: Secondary | ICD-10-CM | POA: Insufficient documentation

## 2021-08-20 ENCOUNTER — Ambulatory Visit: Payer: 59 | Admitting: Physician Assistant

## 2021-08-20 ENCOUNTER — Encounter: Payer: Self-pay | Admitting: Physician Assistant

## 2021-08-20 VITALS — BP 124/50 | HR 70 | Ht 73.0 in | Wt 162.1 lb

## 2021-08-20 DIAGNOSIS — R1310 Dysphagia, unspecified: Secondary | ICD-10-CM | POA: Diagnosis not present

## 2021-08-20 DIAGNOSIS — R933 Abnormal findings on diagnostic imaging of other parts of digestive tract: Secondary | ICD-10-CM | POA: Diagnosis not present

## 2021-08-20 NOTE — Patient Instructions (Signed)
If you are age 64 or younger, your body mass index should be between 19-25. Your Body mass index is 21.39 kg/m. If this is out of the aformentioned range listed, please consider follow up with your Primary Care Provider.  __________________________________________________________  The  GI providers would like to encourage you to use [facility] to communicate with providers for non-urgent requests or questions.  Due to long hold times on the telephone, sending your provider a message by [facility] may be a faster and more efficient way to get a response.  Please allow 48 business hours for a response.  Please remember that this is for non-urgent requests.   You have been scheduled for an endoscopy. Please follow written instructions given to you at your visit today. If you use inhalers (even only as needed), please bring them with you on the day of your procedure.  Follow up pending the results of your Endoscopy.  Thank you for entrusting me with your care and choosing [facility].  Amy Esterwood, PA-C

## 2021-08-20 NOTE — Progress Notes (Signed)
Subjective:    Patient ID: Dylan Barton, male    DOB: 06-01-1956, 65 y.o.   MRN: [id number]  HPI Dylan Barton is a pleasant 65 year old white male, established with Dr. Carlean Purl who comes in today with new concern regarding dysphagia.  Patient has not had any prior endoscopic evaluation. He says that he had mentioned to his PCP several months ago that he was having some vague issues with swallowing pills with sensation that they were sometimes "sticking".  He was scheduled for a barium swallow after being evaluated by ENT and this was done March 2022.  This showed fold thickening with mildly granular appearance in the distal esophagus raising question of underlying Barrett's, there was normal esophageal distensibility and intact primary wave.  Patient says he is not been having any significant difficulty over the past couple of months.  He denies any problems with dysphagia to liquids or solids and only occasionally may have some vague dysphagia to pills.  He points to the throat area.  No regular heartburn or indigestion.  Appetite has been fine, weight has been stable.  Patient does have history of adenomatous colon polyps and family history of colon cancer in his brother. Last colonoscopy was done March 2022 with finding of 1 diminutive polyp in the transverse colon and pandiverticulosis.  Path consistent with tubular adenoma, no high-grade dysplasia and is indicated for 5-year interval follow-up.  Review of Systems Pertinent positive and negative review of systems were noted in the above HPI section.  All other review of systems was otherwise negative.   Outpatient Encounter Medications as of 08/20/2021  Medication Sig   alum & mag hydroxide-simeth (MAALOX/MYLANTA) 200-200-20 MG/5ML suspension Take by mouth every 6 (six) hours as needed for indigestion or heartburn.   ASPIRIN 81 PO Take 1 mg by mouth daily.   ibuprofen (ADVIL,MOTRIN) 200 MG tablet Take 200 mg by mouth every 6 (six) hours as needed.    losartan (COZAAR) 50 MG tablet 1 tablet   tadalafil (CIALIS) 20 MG tablet 1 tablet   No facility-administered encounter medications on file as of 08/20/2021.   No Known Allergies Patient Active Problem List   Diagnosis Date Noted   Personal history of colonic polyps - adenomas 08/07/2014   Family history of colon cancer - brother 62 06/05/2014   Social History   Socioeconomic History   Marital status: Single    Spouse name: Not on file   Number of children: Not on file   Years of education: Not on file   Highest education level: Not on file  Occupational History   Occupation: Clinical biochemist  Tobacco Use   Smoking status: Former    Types: Cigarettes    Quit date: 12/15/2001    Years since quitting: 19.6   Smokeless tobacco: Never  Vaping Use   Vaping Use: Never used  Substance and Sexual Activity   Alcohol use: Yes    Alcohol/week: 3.0 standard drinks    Types: 3 Cans of beer per week   Drug use: No   Sexual activity: Not on file  Other Topics Concern   Not on file  Social History Narrative   Single, no children   He works as an Clinical biochemist traveling to [employer]    One alcoholic drink per day 2 caffeinated beverages daily   Social Determinants of Radio broadcast assistant Strain: Not on file  Food Insecurity: Not on file  Transportation Needs: Not on file  Physical Activity: Not on file  Stress: Not on file  Social Connections: Not on file  Intimate Partner Violence: Not on file    Dylan Barton family history includes CVA (age of onset: 39) in his mother; Colon cancer (age of onset: 44) in his brother; Colon polyps in his brother.      Objective:    Vitals:   08/20/21 0925  BP: (!) 124/50  Pulse: 70    Physical Exam.Well-developed well-nourished older WM  in no acute distress.  Height, Weight, 162 BMI 21.3  HEENT; nontraumatic normocephalic, EOMI, PE R LA, sclera anicteric. Oropharynx; not examined today Neck; supple, no  JVD Cardiovascular; regular rate and rhythm with S1-S2, no murmur rub or gallop Pulmonary; Clear bilaterally Abdomen; soft, nontender, nondistended, no palpable mass or hepatosplenomegaly, bowel sounds are active Rectal; not done today Skin; benign exam, no jaundice rash or appreciable lesions Extremities; no clubbing cyanosis or edema skin warm and dry Neuro/Psych; alert and oriented x4, grossly nonfocal mood and affect appropriate        Assessment & Plan:   #16 65 year old white male with recent abnormal barium swallow suggesting fold thickening in the distal esophagus question underlying Barrett's.  Patient had complaint of vague dysphagia to pills which is only occurring intermittently, no other dysphagia complaints and no complaints of heartburn or indigestion.  Rule out chronic esophagitis, rule out Barrett's  #2 personal history of adenomatous colon polyps-up-to-date with colonoscopy just in March 2022 and indicated for 5-year interval follow-up #3.  Family history of colon cancer in patient's brother.  #4 hypertension  Plan; patient will be scheduled for upper endoscopy with Dr. Carlean Purl, possible esophageal dilation.  Procedure was discussed in detail with patient including indications risks and benefits and he is agreeable to proceed. As of today's visit patient is appropriate for endoscopic evaluation in the ambulatory care setting. Further recommendations pending findings at EGD.  [name] PA-C 08/20/2021   Cc: Deland Pretty, MD

## 2021-09-23 ENCOUNTER — Ambulatory Visit (AMBULATORY_SURGERY_CENTER): Payer: 59 | Admitting: Internal Medicine

## 2021-09-23 ENCOUNTER — Encounter: Payer: Self-pay | Admitting: Internal Medicine

## 2021-09-23 ENCOUNTER — Other Ambulatory Visit: Payer: Self-pay

## 2021-09-23 VITALS — BP 108/72 | HR 59 | Temp 97.7°F | Resp 11 | Ht 73.0 in | Wt 162.0 lb

## 2021-09-23 DIAGNOSIS — R1310 Dysphagia, unspecified: Secondary | ICD-10-CM

## 2021-09-23 DIAGNOSIS — R935 Abnormal findings on diagnostic imaging of other abdominal regions, including retroperitoneum: Secondary | ICD-10-CM

## 2021-09-23 MED ORDER — SODIUM CHLORIDE 0.9 % IV SOLN: 500.0000 mL | Freq: Once | INTRAVENOUS | Status: DC

## 2021-09-23 NOTE — Patient Instructions (Addendum)
I did not see any significant problems. I did dilate the esophagus to see if that would help.  I appreciate the opportunity to care for you. Gatha Mayer, MD, FACG    YOU HAD AN ENDOSCOPIC PROCEDURE TODAY AT [facility]:   Refer to the procedure report that was given to you for any specific questions about what was found during the examination.  If the procedure report does not answer your questions, please call your gastroenterologist to clarify.  If you requested that your care partner not be given the details of your procedure findings, then the procedure report has been included in a sealed envelope for you to review at your convenience later.  YOU SHOULD EXPECT: Some feelings of bloating in the abdomen. Passage of more gas than usual.  Walking can help get rid of the air that was put into your GI tract during the procedure and reduce the bloating. If you had a lower endoscopy (such as a colonoscopy or flexible sigmoidoscopy) you may notice spotting of blood in your stool or on the toilet paper. If you underwent a bowel prep for your procedure, you may not have a normal bowel movement for a few days.  Please Note:  You might notice some irritation and congestion in your nose or some drainage.  This is from the oxygen used during your procedure.  There is no need for concern and it should clear up in a day or so.  SYMPTOMS TO REPORT IMMEDIATELY:  Following upper endoscopy (EGD)  Vomiting of blood or coffee ground material  New chest pain or pain under the shoulder blades  Painful or persistently difficult swallowing  New shortness of breath  Fever of 100F or higher  Black, tarry-looking stools  For urgent or emergent issues, a gastroenterologist can be reached at any hour by calling [phone]. Do not use MyChart messaging for urgent concerns.    DIET:  Dilation diet today(refer to handout), but then you may proceed to your regular diet tomorrow.  Drink plenty  of fluids but you should avoid alcoholic beverages for 24 hours.  ACTIVITY:  You should plan to take it easy for the rest of today and you should NOT DRIVE or use heavy machinery until tomorrow (because of the sedation medicines used during the test).    FOLLOW UP: Our staff will call the number listed on your records 48-72 hours following your procedure to check on you and address any questions or concerns that you may have regarding the information given to you following your procedure. If we do not reach you, we will leave a message.  We will attempt to reach you two times.  During this call, we will ask if you have developed any symptoms of COVID 19. If you develop any symptoms (ie: fever, flu-like symptoms, shortness of breath, cough etc.) before then, please call [phone].  If you test positive for Covid 19 in the 2 weeks post procedure, please call and report this information to [place].    SIGNATURES/CONFIDENTIALITY: You and/or your care partner have signed paperwork which will be entered into your electronic medical record.  These signatures attest to the fact that that the information above on your After Visit Summary has been reviewed and is understood.  Full responsibility of the confidentiality of this discharge information lies with you and/or your care-partner.

## 2021-09-23 NOTE — Op Note (Signed)
[facility] Patient Name: Dylan Barton Procedure Date: 09/23/2021 3:56 PM MRN: [id number] Endoscopist: Gatha Mayer , MD Age: 65 Referring MD:  Date of Birth: [birth year] Gender: Male Account #: [id number] Procedure:                Upper GI endoscopy Indications:              Dysphagia, Abnormal cine-esophagram Medicines:                Propofol per Anesthesia, Monitored Anesthesia Care Procedure:                Pre-Anesthesia Assessment:                           - Prior to the procedure, a History and Physical                            was performed, and patient medications and                            allergies were reviewed. The patient's tolerance of                            previous anesthesia was also reviewed. The risks                            and benefits of the procedure and the sedation                            options and risks were discussed with the patient.                            All questions were answered, and informed consent                            was obtained. Prior Anticoagulants: The patient has                            taken no previous anticoagulant or antiplatelet                            agents. ASA Grade Assessment: II - A patient with                            mild systemic disease. After reviewing the risks                            and benefits, the patient was deemed in                            satisfactory condition to undergo the procedure.                           After obtaining informed consent, the endoscope was  passed under direct vision. Throughout the                            procedure, the patient's blood pressure, pulse, and                            oxygen saturations were monitored continuously. The                            Endoscope was introduced through the mouth, and                            advanced to the second part of duodenum. The upper                             GI endoscopy was accomplished without difficulty.                            The patient tolerated the procedure well. Scope In: Scope Out: Findings:                 No endoscopic abnormality was evident in the                            esophagus to explain the patient's complaint of                            dysphagia. It was decided, however, to proceed with                            dilation of the entire esophagus. The scope was                            withdrawn. Dilation was performed with a Maloney                            dilator with mild resistance at 48 Fr. The dilation                            site was examined following endoscope reinsertion                            and showed no change. Estimated blood loss was                            minimal.                           The entire examined stomach was normal.                           The cardia and gastric fundus were normal on  retroflexion.                           The examined duodenum was normal. Complications:            No immediate complications. Estimated Blood Loss:     Estimated blood loss: none. Impression:               - No endoscopic esophageal abnormality to explain                            patient's dysphagia. Esophagus dilated. Dilated. No                            mucosal abnormalities as suggested on barium swallow                           - Normal stomach.                           - Normal examined duodenum.                           - No specimens collected. Recommendation:           - Patient has a contact number available for                            emergencies. The signs and symptoms of potential                            delayed complications were discussed with the                            patient. Return to normal activities tomorrow.                            Written discharge instructions were provided to the                             patient.                           - Continue present medications.                           - Clear liquids x 1 hour then soft foods rest of                            day. Start prior diet tomorrow.                           - Continue present medications. Gatha Mayer, MD 09/23/2021 4:22:55 PM This report has been signed electronically.

## 2021-09-23 NOTE — Progress Notes (Signed)
Report to PACU, RN, vss, BBS= Clear.  

## 2021-09-23 NOTE — Progress Notes (Signed)
[facility] History and Physical   Primary Care Physician:  Deland Pretty, MD   Reason for Procedure:   Dysphagia and abnormal esophagus on ba swallow  Plan:    EGD, possible esophageal dilation     HPI: Dylan Barton is a 65 y.o. male here to evaluate abnormal barium swallow  - fold pattern raises ? Barrett's. Also w/ pill dysphagia  IMPRESSION: 1. Fold thickening with mildly granular pattern in the distal esophagus, finding could be seen in the setting of Barrett's esophagus, consider esophagoscopy as warranted for further evaluation. 2. Fold thickening versus is barium related artifact at the into the examination in the stomach. This could be assessed at the time of endoscopic evaluation. 3. Otherwise unremarkable esophagram.   Past Medical History:  Diagnosis Date   Epididymitis    GERD (gastroesophageal reflux disease)    OCC - uses Mylanta PRN    Hemorrhoids, internal, with bleeding 06/05/2014   Anoscopy 06/05/2014 shows small grade 1 hemorrhoids 3 positions    Hypertension    Personal history of colonic polyps - adenomas 08/07/2014   08/07/2014 12-15 mm flat cecal polyp and 3 diminutive right colon polyps (1 not recovered) - adenomas - repeat colonoscopy 2018      Past Surgical History:  Procedure Laterality Date   COLONOSCOPY     POLYPECTOMY      Prior to Admission medications   Medication Sig Start Date End Date Taking? Authorizing Provider  alum & mag hydroxide-simeth (MAALOX/MYLANTA) 200-200-20 MG/5ML suspension Take by mouth every 6 (six) hours as needed for indigestion or heartburn.   Yes [provider]  ASPIRIN 81 PO Take 1 mg by mouth daily.   Yes [provider]  losartan (COZAAR) 50 MG tablet 1 tablet 02/10/20  Yes [provider]  ibuprofen (ADVIL,MOTRIN) 200 MG tablet Take 200 mg by mouth every 6 (six) hours as needed.    [provider]  tadalafil (CIALIS) 20 MG tablet 1 tablet 01/21/19   [provider]    Current Outpatient Medications  Medication Sig Dispense Refill   alum & mag hydroxide-simeth (MAALOX/MYLANTA) 200-200-20 MG/5ML suspension Take by mouth every 6 (six) hours as needed for indigestion or heartburn.     ASPIRIN 81 PO Take 1 mg by mouth daily.     losartan (COZAAR) 50 MG tablet 1 tablet     ibuprofen (ADVIL,MOTRIN) 200 MG tablet Take 200 mg by mouth every 6 (six) hours as needed.     tadalafil (CIALIS) 20 MG tablet 1 tablet     Current Facility-Administered Medications  Medication Dose Route Frequency Provider Last Rate Last Admin   0.9 %  sodium chloride infusion  500 mL Intravenous Once Gatha Mayer, MD        Allergies as of 09/23/2021   (No Known Allergies)    Family History  Problem Relation Age of Onset   CVA Mother 8   Colon cancer Brother 22   Colon polyps Brother    Esophageal cancer Neg Hx    Rectal cancer Neg Hx    Stomach cancer Neg Hx     Social History   Socioeconomic History   Marital status: Single    Spouse name: Not on file   Number of children: Not on file   Years of education: Not on file   Highest education level: Not on file  Occupational History   Occupation: Clinical biochemist  Tobacco Use   Smoking status: Former    Types: Cigarettes  Quit date: 12/15/2001    Years since quitting: 19.7   Smokeless tobacco: Never  Vaping Use   Vaping Use: Never used  Substance and Sexual Activity   Alcohol use: Yes    Alcohol/week: 3.0 standard drinks    Types: 3 Cans of beer per week   Drug use: No   Sexual activity: Not on file  Other Topics Concern   Not on file  Social History Narrative   Single, no children   He works as an Clinical biochemist traveling to [employer]    One alcoholic drink per day 2 caffeinated beverages daily   Social Determinants of Radio broadcast assistant Strain: Not on file  Food Insecurity: Not on file  Transportation Needs: Not on file  Physical Activity: Not on file  Stress: Not  on file  Social Connections: Not on file  Intimate Partner Violence: Not on file    Review of Systems:  All other review of systems negative except as mentioned in the HPI.  Physical Exam: Vital signs BP 134/69   Pulse 67   Temp 97.7 F (36.5 C)   Ht 6\' 1"  (1.854 m)   Wt 162 lb (73.5 kg)   SpO2 99%   BMI 21.37 kg/m   General:   Alert,  Well-developed, well-nourished, pleasant and cooperative in NAD Lungs:  Clear throughout to auscultation.   Heart:  Regular rate and rhythm; no murmurs, clicks, rubs,  or gallops. Abdomen:  Soft, nontender and nondistended. Normal bowel sounds.   Neuro/Psych:  Alert and cooperative. Normal mood and affect. A and O x 3   @Magdalena Skilton  Simonne Maffucci, MD, [facility] [phone] (pager) 09/23/2021 4:02 PM@

## 2021-09-23 NOTE — Progress Notes (Signed)
VS completed by DT.

## 2021-09-25 ENCOUNTER — Telehealth: Payer: Self-pay | Admitting: *Deleted

## 2021-09-25 NOTE — Telephone Encounter (Signed)
  Follow up Call-  Call back number 09/23/2021 02/20/2021  Post procedure Call Back phone  # [phone]  Permission to leave phone message Yes Yes  Some recent data might be hidden     Patient questions:  Do you have a fever, pain , or abdominal swelling? No. Pain Score  0 *  Have you tolerated food without any problems? Yes.    Have you been able to return to your normal activities? Yes.    Do you have any questions about your discharge instructions: Diet   No. Medications  No. Follow up visit  No.  Do you have questions or concerns about your Care? No.  Actions: * If pain score is 4 or above: No action needed, pain <4.  Have you developed a fever since your procedure? no  2.   Have you had an respiratory symptoms (SOB or cough) since your procedure? no  3.   Have you tested positive for COVID 19 since your procedure no  4.   Have you had any family members/close contacts diagnosed with the COVID 19 since your procedure?  no   If yes to any of these questions please route to Joylene John, RN and Joella Prince, RN

## 2022-10-17 IMAGING — RF DG ESOPHAGUS
13 of 19 series · 15 of 24 positions shown · non-contrast
Comparison: None

CLINICAL DATA: Or pharyngeal dysphagia, globus sensation.

EXAM:
ESOPHOGRAM / BARIUM SWALLOW / BARIUM TABLET STUDY
TECHNIQUE: Combined double contrast and single contrast examination performed
using effervescent crystals, thick barium liquid, and thin barium
liquid. The patient was observed with fluoroscopy swallowing a 13 mm
barium sulphate tablet.
FLUOROSCOPY TIME:  Fluoroscopy Time:  2 minutes 18 seconds
Radiation Exposure Index (if provided by the fluoroscopic device):
11.6 mGy
Number of Acquired Spot Images: 4

[Series 1: cp_standard · 0.35mm/px · 1 of 36 frames shown (1 of 12)]
[frame 6/36]
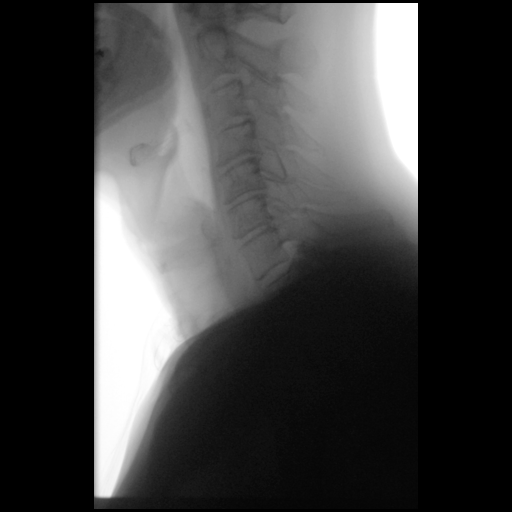

[Series 2: cp_standard · 0.35mm/px · 1 of 18 frames shown (2 of 12)]
[frame 10/18]
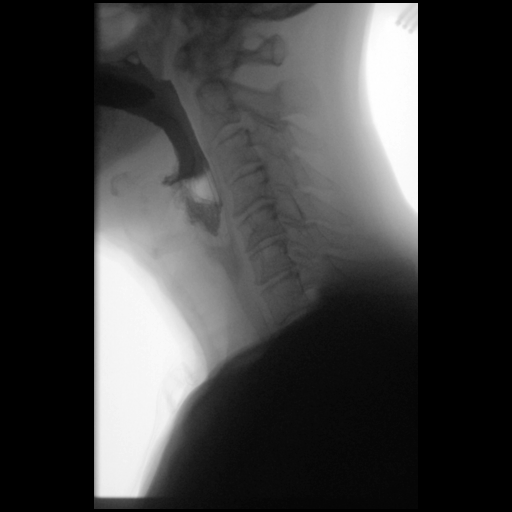

[Series 4: cp_standard · 0.37mm/px · 2 of 7 frames shown (3 of 12)]
[frame 4/7]
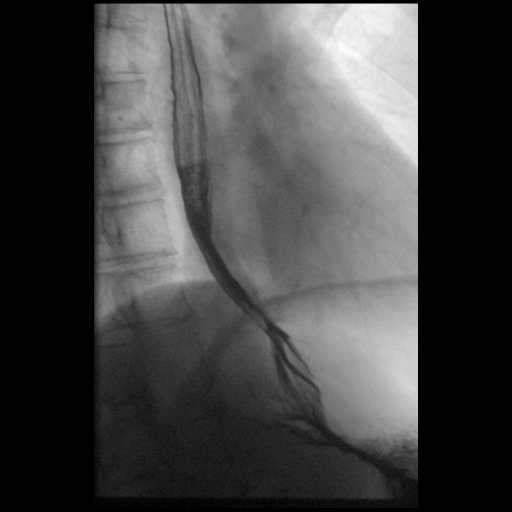
[frame 6/7]
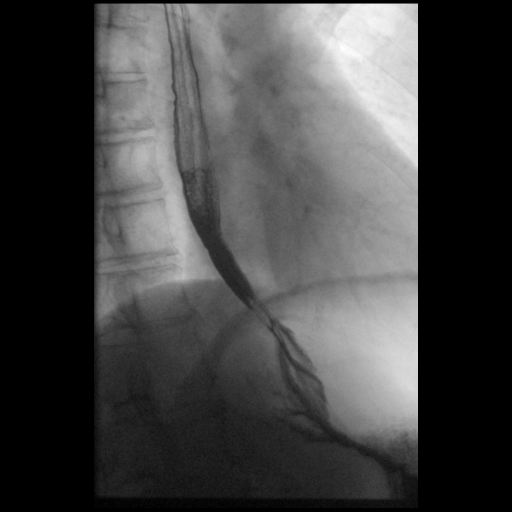

[Series 6: cp_standard · 0.37mm/px · 1 of 5 frames shown (4 of 12)]
[frame 3/5]
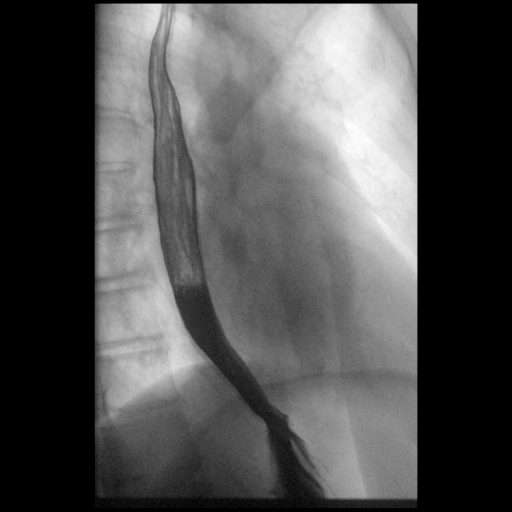

[Series 7: fluoro_barium 2fps_bw · 0.18mm/px · 1 of 1 slices shown]
[im 1/1]
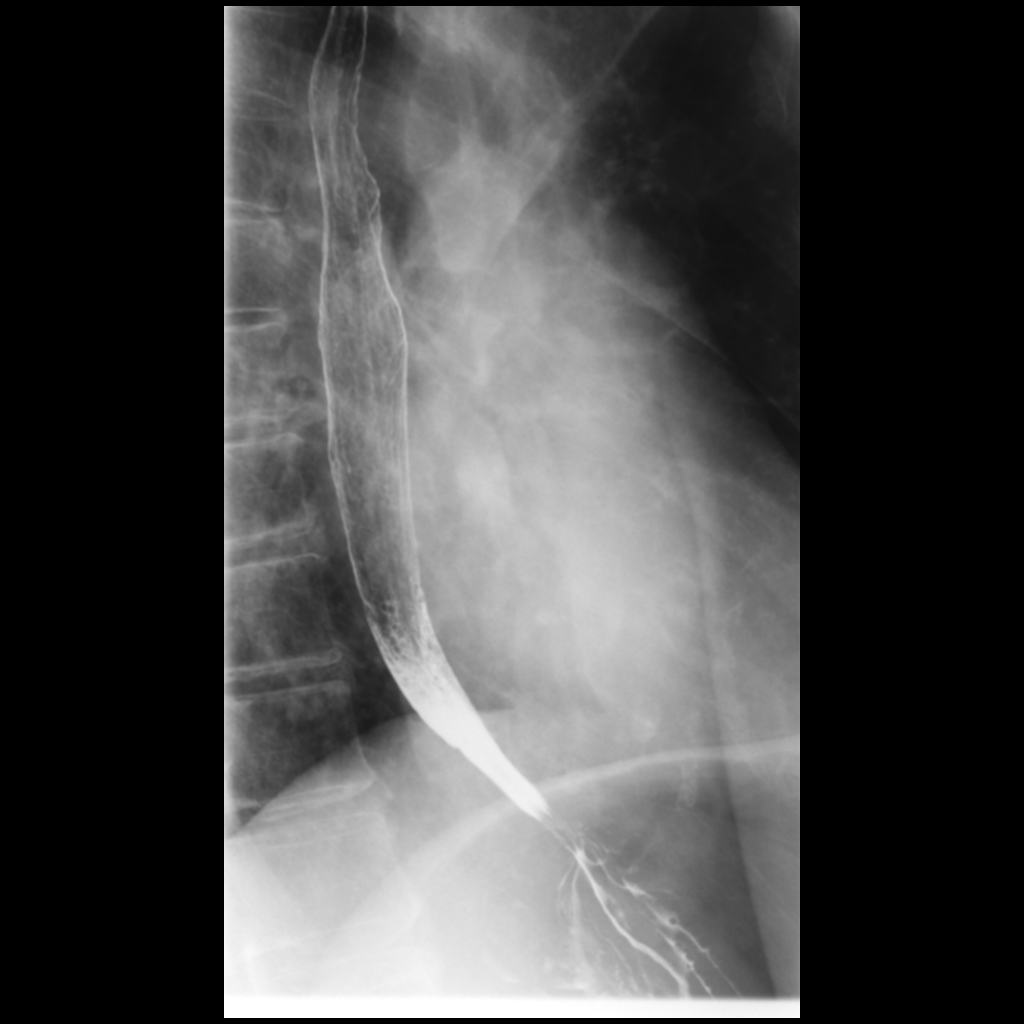

[Series 10: cp_standard · 0.37mm/px · 1 of 7 frames shown (5 of 12)]
[frame 2/7]
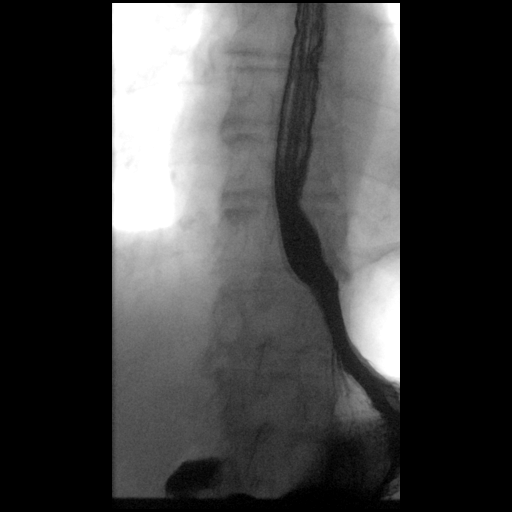

[Series 12: cp_standard · 0.37mm/px · 1 of 13 frames shown (6 of 12)]
[frame 7/13]
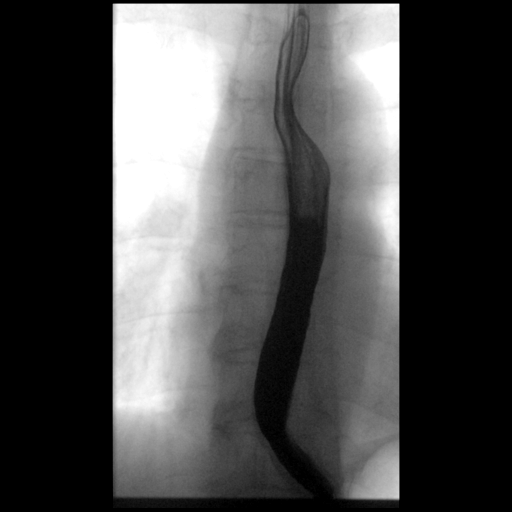

[Series 13: cp_standard · 0.37mm/px · 1 of 3 frames shown (7 of 12)]
[frame 1/3]
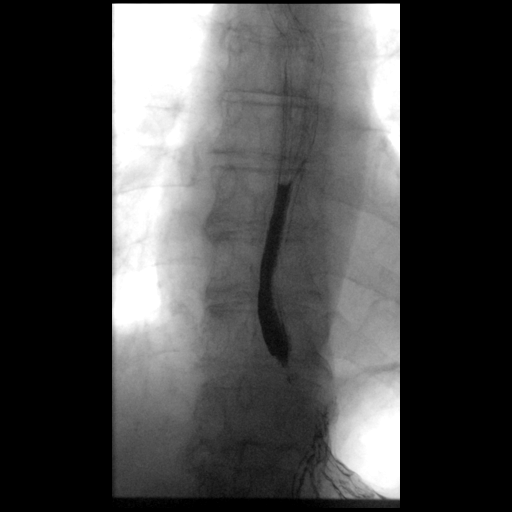

[Series 16: cp_standard · 0.37mm/px · 2 of 18 frames shown (8 of 12)]
[frame 10/18]
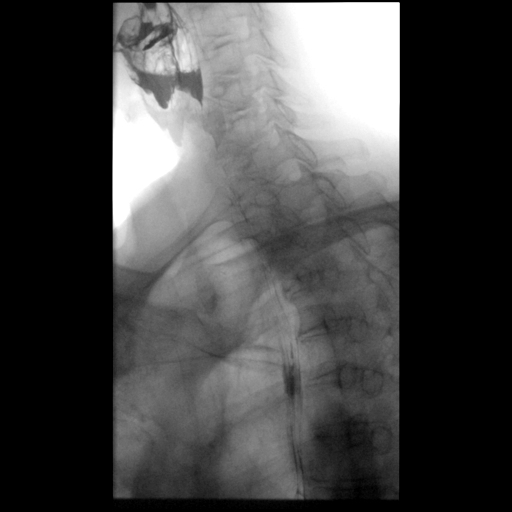
[frame 18/18]
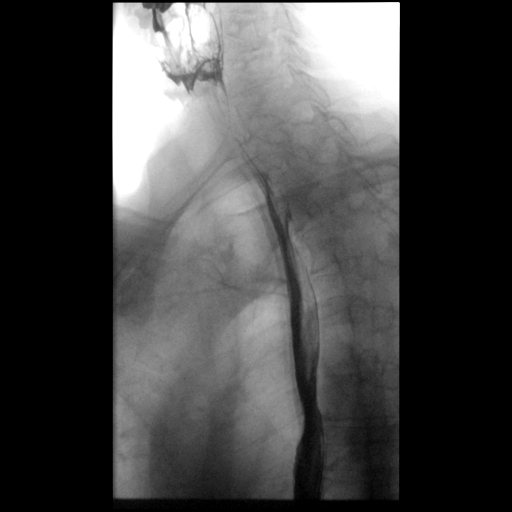

[Series 19: cp_standard · 0.19mm/px · 1 of 1 slices shown (9 of 12)]
[im 1/1]
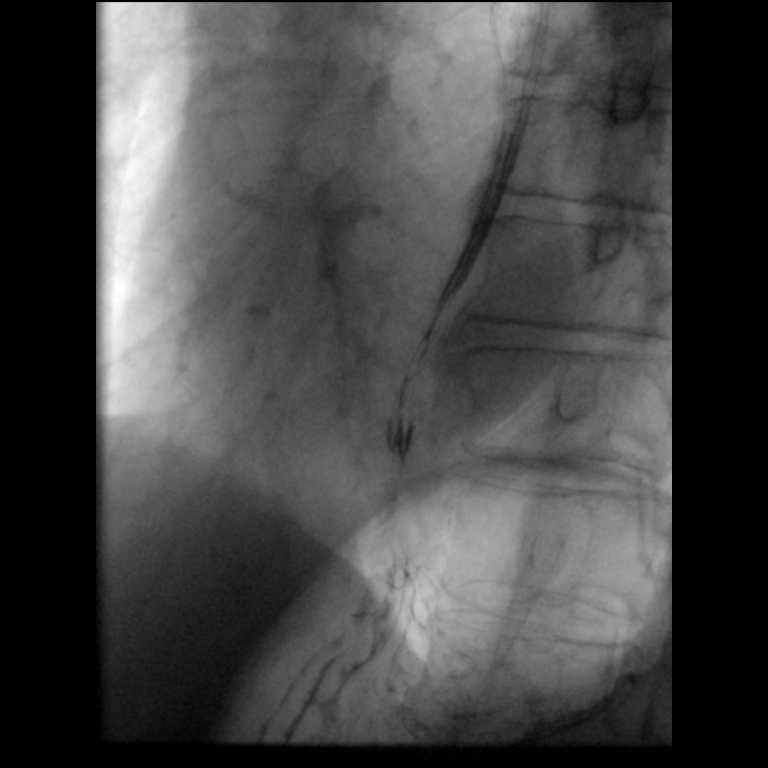

[Series 22: cp_standard · 0.38mm/px · 1 of 10 frames shown (10 of 12)]
[frame 9/10]
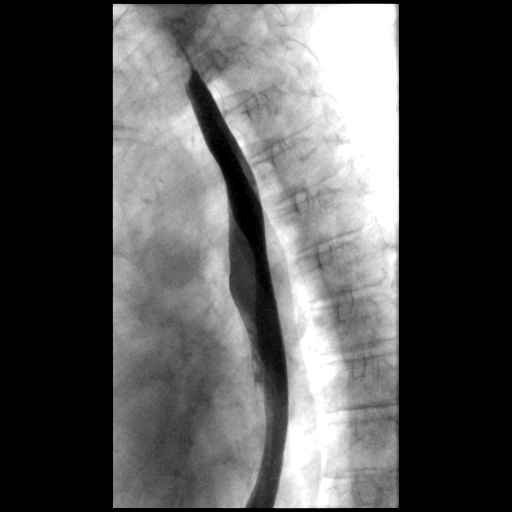

[Series 23: cp_standard · 0.38mm/px · 1 of 7 frames shown (11 of 12)]
[frame 3/7]
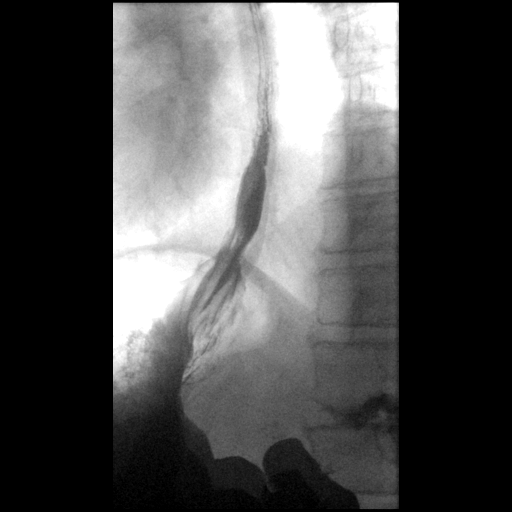

[Series 24: cp_standard · 0.38mm/px · 1 of 5 frames shown (12 of 12)]
[frame 5/5]
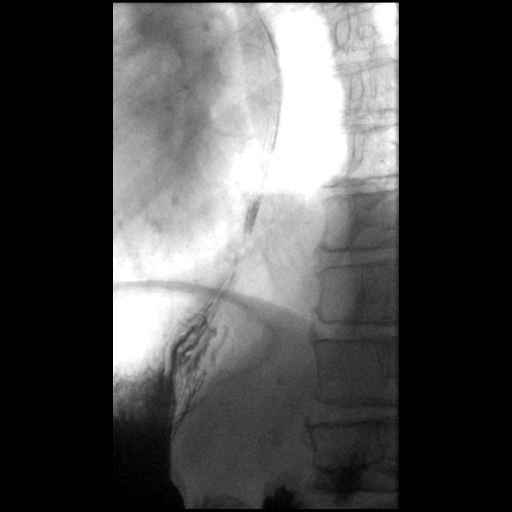

[15 of 24 positions shown; findings below may reference images not displayed]

FINDINGS: Thin barium was administered showing no gross swallow dysfunction.
Esophagus was then evaluated using double contrast technique
following administration of effervescent crystals.

This showed normal esophageal distensibility. Mild mucosal
irregularity and fold thickening was present in the distal third of
the esophagus perhaps with a background granular pattern

Intact primary wave with mild tertiary peristaltic activity. Gastric
cardia appears normal though on upright assessment question of fold
thickening in the stomach versus artifact.

Barium tablet was administered which passed readily into the
stomach.

No gastroesophageal reflux during short-term observation.
IMPRESSION: 1. Fold thickening with mildly granular pattern in the distal
esophagus, finding could be seen in the setting of Barrett's
esophagus, consider esophagoscopy as warranted for further
evaluation.
2. Fold thickening versus is barium related artifact at the into the
examination in the stomach. This could be assessed at the time of
endoscopic evaluation.
3. Otherwise unremarkable esophagram.

## 2023-04-06 ENCOUNTER — Other Ambulatory Visit: Payer: Self-pay | Admitting: Urology

## 2023-04-06 DIAGNOSIS — R972 Elevated prostate specific antigen [PSA]: Secondary | ICD-10-CM

## 2023-06-06 ENCOUNTER — Ambulatory Visit
Admission: RE | Admit: 2023-06-06 | Discharge: 2023-06-06 | Disposition: A | Discharge status: Home / Self Care | Payer: 59 | Source: Ambulatory Visit | Attending: Urology | Admitting: Urology

## 2023-06-06 DIAGNOSIS — R972 Elevated prostate specific antigen [PSA]: Secondary | ICD-10-CM

## 2023-06-06 MED ORDER — GADOPICLENOL 0.5 MMOL/ML IV SOLN
7.0000 mL | Freq: Once | INTRAVENOUS | Status: AC | PRN
  Administered 2023-06-06: 7 mL via INTRAVENOUS

## 2023-07-14 ENCOUNTER — Ambulatory Visit (HOSPITAL_COMMUNITY)
Admission: EM | Admit: 2023-07-14 | Discharge: 2023-07-14 | Disposition: A | Discharge status: Home / Self Care | Payer: 59 | Attending: Family Medicine | Admitting: Family Medicine

## 2023-07-14 ENCOUNTER — Encounter (HOSPITAL_COMMUNITY): Payer: Self-pay | Admitting: Emergency Medicine

## 2023-07-14 ENCOUNTER — Ambulatory Visit (HOSPITAL_COMMUNITY)
Admission: RE | Admit: 2023-07-14 | Discharge: 2023-07-14 | Disposition: A | Discharge status: Home / Self Care | Payer: 59 | Source: Ambulatory Visit | Attending: Family Medicine | Admitting: Family Medicine

## 2023-07-14 DIAGNOSIS — M7989 Other specified soft tissue disorders: Secondary | ICD-10-CM

## 2023-07-14 DIAGNOSIS — S8011XA Contusion of right lower leg, initial encounter: Secondary | ICD-10-CM

## 2023-07-14 NOTE — ED Provider Notes (Addendum)
[facility]    CSN: [id number] Arrival date & time: 07/14/23  0818      History   Chief Complaint Chief Complaint  Patient presents with   Leg Pain   Foot Swelling    HPI Dylan Barton is a 67 y.o. male.    Leg Pain Here for swelling and bruising and pain in his right lower leg.  On July 24 a heavy door was on its side near him and it fell over and struck his right calf on the posterior right lower leg.  It really started swelling and about 2 days after that.  It hurts when he is up and about on it.  No fever or chills.  It also gets stiff overnight.  He takes losartan.    Past Medical History:  Diagnosis Date   Epididymitis    GERD (gastroesophageal reflux disease)    OCC - uses Mylanta PRN    Hemorrhoids, internal, with bleeding 06/05/2014   Anoscopy 06/05/2014 shows small grade 1 hemorrhoids 3 positions    Hypertension    Personal history of colonic polyps - adenomas 08/07/2014   08/07/2014 12-15 mm flat cecal polyp and 3 diminutive right colon polyps (1 not recovered) - adenomas - repeat colonoscopy 2018      Patient Active Problem List   Diagnosis Date Noted   Personal history of colonic polyps - adenomas 08/07/2014   Family history of colon cancer - brother 26 06/05/2014    Past Surgical History:  Procedure Laterality Date   COLONOSCOPY     POLYPECTOMY         Home Medications    Prior to Admission medications   Medication Sig Start Date End Date Taking? Authorizing Provider  alum & mag hydroxide-simeth (MAALOX/MYLANTA) 200-200-20 MG/5ML suspension Take by mouth every 6 (six) hours as needed for indigestion or heartburn.    [provider]  ibuprofen (ADVIL,MOTRIN) 200 MG tablet Take 200 mg by mouth every 6 (six) hours as needed.    [provider]  losartan (COZAAR) 50 MG tablet 1 tablet 02/10/20   [provider]  tadalafil (CIALIS) 20 MG tablet 1 tablet 01/21/19   [provider]    Family  History Family History  Problem Relation Age of Onset   CVA Mother 40   Colon cancer Brother 38   Colon polyps Brother    Esophageal cancer Neg Hx    Rectal cancer Neg Hx    Stomach cancer Neg Hx     Social History Social History   Tobacco Use   Smoking status: Former    Current packs/day: 0.00    Types: Cigarettes    Quit date: 12/15/2001    Years since quitting: 21.5   Smokeless tobacco: Never  Vaping Use   Vaping status: Never Used  Substance Use Topics   Alcohol use: Yes    Alcohol/week: 3.0 standard drinks of alcohol    Types: 3 Cans of beer per week   Drug use: No     Allergies   Patient has no known allergies.   Review of Systems Review of Systems   Physical Exam Triage Vital Signs ED Triage Vitals  Encounter Vitals Group     BP 07/14/23 0846 (!) 144/80     Systolic BP Percentile --      Diastolic BP Percentile --      Pulse Rate 07/14/23 0846 79     Resp 07/14/23 0846 17     Temp 07/14/23 0846  98.3 F (36.8 C)     Temp Source 07/14/23 0846 Oral     SpO2 07/14/23 0846 97 %     Weight --      Height --      Head Circumference --      Peak Flow --      Pain Score 07/14/23 0845 4     Pain Loc --      Pain Education --      Exclude from Growth Chart --    No data found.  Updated Vital Signs BP (!) 144/80 (BP Location: Left Arm)   Pulse 79   Temp 98.3 F (36.8 C) (Oral)   Resp 17   SpO2 97%   Visual Acuity Right Eye Distance:   Left Eye Distance:   Bilateral Distance:    Right Eye Near:   Left Eye Near:    Bilateral Near:     Physical Exam Vitals reviewed.  Constitutional:      General: He is not in acute distress.    Appearance: He is not ill-appearing, toxic-appearing or diaphoretic.  Musculoskeletal:     Comments: There is some greenish faint ecchymosis over the posterior right lower leg.  There is tenderness from the top of the gastrocnemius to the lower leg.  There is some induration in the lower part there is no warmth.   There is no erythema.  Pulses normal over the dorsalis pedis and posterior tibial areas.  There is some very mild edema in the ankle area.  Skin:    Coloration: Skin is not pale.  Neurological:     General: No focal deficit present.     Mental Status: He is alert and oriented to person, place, and time.  Psychiatric:        Behavior: Behavior normal.      UC Treatments / Results  Labs (all labs ordered are listed, but only abnormal results are displayed) Labs Reviewed - No data to display  EKG   Radiology No results found.  Procedures Procedures (including critical care time)  Medications Ordered in UC Medications - No data to display  Initial Impression / Assessment and Plan / UC Course  I have reviewed the triage vital signs and the nursing notes.  Pertinent labs & imaging results that were available during my care of the patient were reviewed by me and considered in my medical decision making (see chart for details).        I do think for the most part this is a simple hematoma.  Venous Dopplers done ensure that there is no incidental DVT complicating matters.  Will notify him if it is positive  Ace wrap is applied.  He will continue taking his ibuprofen over-the-counter.  He will follow-up with Dr. Renne Crigler, his PCP Final Clinical Impressions(s) / UC Diagnoses   Final diagnoses:  Right leg swelling  Hematoma of right lower leg     Discharge Instructions      Continue ibuprofen as needed for pain. Please follow-up with Dr. Renne Crigler about this hematoma and bruising.      ED Prescriptions   None    PDMP not reviewed this encounter.   Zenia Resides, MD 07/14/23 [id number]    Zenia Resides, MD 07/14/23 [phone]

## 2023-07-14 NOTE — Progress Notes (Signed)
Right lower extremity venous duplex completed. Please see CV Procedures for preliminary results.  Shona Simpson, RVT 07/14/23 1:05 PM

## 2023-07-14 NOTE — Discharge Instructions (Signed)
Continue ibuprofen as needed for pain. Please follow-up with Dr. Renne Crigler about this hematoma and bruising.

## 2023-07-14 NOTE — ED Triage Notes (Signed)
Pt reports last week a door fell over and hit pt in posterior RLE. Reports sore spot and swelling and bruising to right ankle and foot.

## 2024-09-13 ENCOUNTER — Ambulatory Visit (INDEPENDENT_AMBULATORY_CARE_PROVIDER_SITE_OTHER): Payer: Worker's Compensation

## 2024-09-13 ENCOUNTER — Ambulatory Visit (HOSPITAL_COMMUNITY)
Admission: EM | Admit: 2024-09-13 | Discharge: 2024-09-13 | Disposition: A | Discharge status: Home / Self Care | Payer: Worker's Compensation | Attending: Emergency Medicine | Admitting: Emergency Medicine

## 2024-09-13 DIAGNOSIS — W540XXA Bitten by dog, initial encounter: Secondary | ICD-10-CM

## 2024-09-13 DIAGNOSIS — S61451A Open bite of right hand, initial encounter: Secondary | ICD-10-CM

## 2024-09-13 DIAGNOSIS — Z23 Encounter for immunization: Secondary | ICD-10-CM

## 2024-09-13 MED ORDER — RABIES VACCINE, PCEC IM SUSR
1.0000 mL | Freq: Once | INTRAMUSCULAR | Status: AC
  Administered 2024-09-13: 1 mL via INTRAMUSCULAR

## 2024-09-13 MED ORDER — TETANUS-DIPHTH-ACELL PERTUSSIS 5-2.5-18.5 LF-MCG/0.5 IM SUSY
0.5000 mL | PREFILLED_SYRINGE | Freq: Once | INTRAMUSCULAR | Status: AC
  Administered 2024-09-13: 0.5 mL via INTRAMUSCULAR

## 2024-09-13 MED ORDER — RABIES IMMUNE GLOBULIN 1500 UNIT/10ML IJ SOLN
20.0000 [IU]/kg | Freq: Once | INTRAMUSCULAR | Status: AC
  Administered 2024-09-13: 1500 [IU] via INTRAMUSCULAR

## 2024-09-13 MED ORDER — AMOXICILLIN-POT CLAVULANATE 875-125 MG PO TABS: 1.0000 | ORAL_TABLET | Freq: Two times a day (BID) | ORAL | 0 refills | Status: AC

## 2024-09-13 MED ORDER — TETANUS-DIPHTH-ACELL PERTUSSIS 5-2.5-18.5 LF-MCG/0.5 IM SUSY
PREFILLED_SYRINGE | INTRAMUSCULAR | Status: AC
  Filled 2024-09-13: qty 0.5

## 2024-09-13 MED ORDER — LIDOCAINE HCL (PF) 1 % IJ SOLN
INTRAMUSCULAR | Status: AC
  Filled 2024-09-13: qty 2

## 2024-09-13 MED ORDER — RABIES IMMUNE GLOBULIN 1500 UNIT/10ML IJ SOLN
INTRAMUSCULAR | Status: AC
  Filled 2024-09-13: qty 10

## 2024-09-13 MED ORDER — RABIES VACCINE, PCEC IM SUSR
INTRAMUSCULAR | Status: AC
  Filled 2024-09-13: qty 1

## 2024-09-13 NOTE — Discharge Instructions (Signed)
 Your bones did not appear to be broken.  We placed 1 stitch on the side of your hand they can be removed in 10 to 14 days.  Keep the sutures clean and dry for the next 24 hours.  Afterwards you can clean with warm water and Dial soap.  Pat dry.  Keep covered when outside of your home to help prevent further infection.  I expect the area to bruise and swell significantly.  You can elevate and ice for the next 24 hours.  Ibuprofen 600 mg every 8 hours can help with pain and swelling.  We updated your Tdap and rabies vaccine series.  Please return to clinic on the dates below to continue your rabies vaccination.  On the last day of your rabies vaccine series, day 14, you can also get your sutures removed.  Return sooner if you have any new concerning symptoms.  Meds ordered this encounter  Medications   Tdap (BOOSTRIX ) injection 0.5 mL   Rabies Immune Globulin SOLN 1,500 Units   rabies vaccine (RABAVERT) injection 1 mL   amoxicillin-clavulanate (AUGMENTIN) 875-125 MG tablet    Sig: Take 1 tablet by mouth every 12 (twelve) hours.    Dispense:  14 tablet    Refill:  0    In addition to the human rabies immune globulin (HRIG), you were given the first dose of the rabies vaccine today (Day 0).  Please return here on the following dates to complete the rabies series: Day 3: 09/16/2024 Day 7: 09/20/2024 Day 14: 09/27/2024

## 2024-09-13 NOTE — ED Triage Notes (Signed)
 Patient is here for a dog bite to his right hand. Patient states the dog is not up to date with vaccine. Patient last TDAP was 08/29/2013.

## 2024-09-13 NOTE — ED Provider Notes (Addendum)
 [facility]    CSN: [id number] Arrival date & time: 09/13/24  1729      History   Chief Complaint No chief complaint on file.   HPI Dylan Barton is a 68 y.o. male.   Patient presents to clinic for concern of a dog bite to the right hand.  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  Thinks the dog was maybe a pit bull.  Is having some pain and swelling, full range of motion intact.  Unsure of dog's rabies vaccine status.  Tdap most recently in 2014.  The history is provided by the patient and medical records.    Past Medical History:  Diagnosis Date   Epididymitis    GERD (gastroesophageal reflux disease)    OCC - uses Mylanta PRN    Hemorrhoids, internal, with bleeding 06/05/2014   Anoscopy 06/05/2014 shows small grade 1 hemorrhoids 3 positions    Hypertension    Personal history of colonic polyps - adenomas 08/07/2014   08/07/2014 12-15 mm flat cecal polyp and 3 diminutive right colon polyps (1 not recovered) - adenomas - repeat colonoscopy 2018      Patient Active Problem List   Diagnosis Date Noted   History of colonic polyps 08/07/2014   Family history of colon cancer - brother 43 06/05/2014    Past Surgical History:  Procedure Laterality Date   COLONOSCOPY     POLYPECTOMY         Home Medications    Prior to Admission medications   Medication Sig Start Date End Date Taking? Authorizing Provider  amoxicillin-clavulanate (AUGMENTIN) 875-125 MG tablet Take 1 tablet by mouth every 12 (twelve) hours. 09/13/24  Yes Aaralyn Kil  N, FNP  losartan (COZAAR) 50 MG tablet 1 tablet 02/10/20  Yes [provider]  alum & mag hydroxide-simeth (MAALOX/MYLANTA) 200-200-20 MG/5ML suspension Take by mouth every 6 (six) hours as needed for indigestion or heartburn.    [provider]   ibuprofen (ADVIL,MOTRIN) 200 MG tablet Take 200 mg by mouth every 6 (six) hours as needed.    [provider]  tadalafil (CIALIS) 20 MG tablet 1 tablet 01/21/19   [provider]    Family History Family History  Problem Relation Age of Onset   CVA Mother 10   Colon cancer Brother 58   Colon polyps Brother    Esophageal cancer Neg Hx    Rectal cancer Neg Hx    Stomach cancer Neg Hx     Social History Social History   Tobacco Use   Smoking status: Former    Current packs/day: 0.00    Types: Cigarettes    Quit date: 12/15/2001    Years since quitting: 22.7   Smokeless tobacco: Never  Vaping Use   Vaping status: Never Used  Substance Use Topics   Alcohol use: Yes    Alcohol/week: 3.0 standard drinks of alcohol    Types: 3 Cans of beer per week   Drug use: No     Allergies   Patient has no known allergies.   Review of Systems Review of Systems  Per HPI  Physical Exam Triage Vital Signs ED Triage Vitals  Encounter Vitals Group     BP 09/13/24 1805 (!) 143/78     Girls Systolic BP Percentile --  Girls Diastolic BP Percentile --      Boys Systolic BP Percentile --      Boys Diastolic BP Percentile --      Pulse Rate 09/13/24 1805 71     Resp 09/13/24 1805 20     Temp 09/13/24 1805 98 F (36.7 C)     Temp Source 09/13/24 1805 Oral     SpO2 09/13/24 1805 97 %     Weight 09/13/24 1846 160 lb 12.8 oz (72.9 kg)     Height --      Head Circumference --      Peak Flow --      Pain Score --      Pain Loc --      Pain Education --      Exclude from Growth Chart --    No data found.  Updated Vital Signs BP (!) 143/78 (BP Location: Left Arm)   Pulse 71   Temp 98 F (36.7 C) (Oral)   Resp 20   Wt 160 lb 12.8 oz (72.9 kg)   SpO2 97%   BMI 21.22 kg/m   Visual Acuity Right Eye Distance:   Left Eye Distance:   Bilateral Distance:    Right Eye Near:   Left Eye Near:    Bilateral Near:     Physical Exam Vitals and nursing note  reviewed.  Constitutional:      Appearance: Normal appearance.  HENT:     Head: Normocephalic and atraumatic.     Right Ear: External ear normal.     Left Ear: External ear normal.     Nose: Nose normal.     Mouth/Throat:     Mouth: Mucous membranes are moist.  Eyes:     Conjunctiva/sclera: Conjunctivae normal.  Cardiovascular:     Rate and Rhythm: Normal rate.  Pulmonary:     Effort: Pulmonary effort is normal. No respiratory distress.  Musculoskeletal:        General: Normal range of motion.  Skin:    General: Skin is warm and dry.     Capillary Refill: Capillary refill takes less than 2 seconds.         Comments: 3 dog bites across the dorsal aspect of the right hand, 1 cm open wound to the lateral fifth metacarpal, edges able to be well-approximated.  1 shallow puncture wound along the 2nd metacarpal, and another shallow 1 cm laceration along the third metacarpal, edges unable to be well-approximated.  Neurological:     General: No focal deficit present.     Mental Status: He is alert and oriented to person, place, and time.  Psychiatric:        Mood and Affect: Mood normal.        Behavior: Behavior normal. Behavior is cooperative.      UC Treatments / Results  Labs (all labs ordered are listed, but only abnormal results are displayed) Labs Reviewed - No data to display  EKG   Radiology DG Hand Complete Right Result Date: 09/13/2024 CLINICAL DATA:  Status post dog bite. EXAM: RIGHT HAND - COMPLETE 3+ VIEW COMPARISON:  None Available. FINDINGS: There is no evidence of fracture or dislocation. There is no evidence of arthropathy or other focal bone abnormality. Soft tissues are unremarkable. IMPRESSION: Negative. Electronically Signed   By: Suzen Dials M.D.   On: 09/13/2024 19:31    Procedures Laceration Repair  Date/Time: 09/13/2024 7:33 PM  Performed by: Dreama Lucas SAILOR, FNP Authorized by: Dreama, Elmar Antigua   N, FNP   Consent:    Consent obtained:   Verbal   Consent given by:  Patient   Risks discussed:  Infection, need for additional repair, pain, poor cosmetic result and poor wound healing   Alternatives discussed:  No treatment and delayed treatment Universal protocol:    Procedure explained and questions answered to patient or proxy's satisfaction: yes     Relevant documents present and verified: yes     Test results available: yes     Imaging studies available: yes     Required blood products, implants, devices, and special equipment available: yes     Site/side marked: yes     Immediately prior to procedure, a time out was called: yes     Patient identity confirmed:  Verbally with patient Anesthesia:    Anesthesia method:  Local infiltration   Local anesthetic:  Lidocaine 1% w/o epi Laceration details:    Location:  Hand   Hand location:  R hand, dorsum   Length (cm):  1   Depth (mm):  3 Pre-procedure details:    Preparation:  Patient was prepped and draped in usual sterile fashion Exploration:    Imaging outcome: foreign body not noted     Wound exploration: wound explored through full range of motion   Treatment:    Area cleansed with:  Povidone-iodine   Amount of cleaning:  Standard   Debridement:  None Skin repair:    Repair method:  Sutures   Suture size:  4-0   Suture material:  Prolene   Suture technique:  Simple interrupted   Number of sutures:  1 Approximation:    Approximation:  Close Repair type:    Repair type:  Simple Post-procedure details:    Procedure completion:  Tolerated well, no immediate complications  (including critical care time)  Medications Ordered in UC Medications  Tdap (BOOSTRIX ) injection 0.5 mL (0.5 mLs Intramuscular Given 09/13/24 1908)  Rabies Immune Globulin SOLN 1,500 Units (1,500 Units Intramuscular Given 09/13/24 1915)  rabies vaccine (RABAVERT) injection 1 mL (1 mL Intramuscular Given 09/13/24 1904)    Initial Impression / Assessment and Plan / UC Course  I have  reviewed the triage vital signs and the nursing notes.  Pertinent labs & imaging results that were available during my care of the patient were reviewed by me and considered in my medical decision making (see chart for details).  Vitals and triage reviewed, patient is hemodynamically stable.  Right hand with full range of motion.  Superficial puncture wounds from dog bite, 1 laceration margins are able to be well-approximated, repaired with single suture.  Imaging obtained does not show acute bony abnormality by my interpretation, confirmed with radiology overread.  Tdap updated in clinic.  Rabies vaccine started due to unknown vaccination status of the dog, and it was aggressive.  Had been a child prior to arrival to the shelter.  Will place on Augmentin to help prevent bacterial infection of the bite sites.  Proper follow-up for rabies vaccine series discussed.  Wound care discussed.  Plan of care, follow-up care return precautions given, no questions at this time.     Final Clinical Impressions(s) / UC Diagnoses   Final diagnoses:  Dog bite of right hand, initial encounter     Discharge Instructions      Your bones did not appear to be broken.  We placed 1 stitch on the side of your hand they can be removed in 10 to 14 days.  Keep the  sutures clean and dry for the next 24 hours.  Afterwards you can clean with warm water and Dial soap.  Pat dry.  Keep covered when outside of your home to help prevent further infection.  I expect the area to bruise and swell significantly.  You can elevate and ice for the next 24 hours.  Ibuprofen 600 mg every 8 hours can help with pain and swelling.  We updated your Tdap and rabies vaccine series.  Please return to clinic on the dates below to continue your rabies vaccination.  On the last day of your rabies vaccine series, day 14, you can also get your sutures removed.  Return sooner if you have any new concerning symptoms.  Meds ordered this  encounter  Medications   Tdap (BOOSTRIX ) injection 0.5 mL   Rabies Immune Globulin SOLN 1,500 Units   rabies vaccine (RABAVERT) injection 1 mL   amoxicillin-clavulanate (AUGMENTIN) 875-125 MG tablet    Sig: Take 1 tablet by mouth every 12 (twelve) hours.    Dispense:  14 tablet    Refill:  0    In addition to the human rabies immune globulin (HRIG), you were given the first dose of the rabies vaccine today (Day 0).  Please return here on the following dates to complete the rabies series: Day 3: 09/16/2024 Day 7: 09/20/2024 Day 14: 09/27/2024      ED Prescriptions     Medication Sig Dispense Auth. Provider   amoxicillin-clavulanate (AUGMENTIN) 875-125 MG tablet Take 1 tablet by mouth every 12 (twelve) hours. 14 tablet Dreama, Shonta Phillis  N, FNP      PDMP not reviewed this encounter.   Dreama, Ormond Lazo  N, FNP 09/13/24 1935    Dreama, Kindall Swaby  N, FNP 09/13/24 [phone]

## 2024-09-16 ENCOUNTER — Ambulatory Visit (HOSPITAL_COMMUNITY)
Admission: EM | Admit: 2024-09-16 | Discharge: 2024-09-16 | Disposition: A | Discharge status: Home / Self Care | Payer: Worker's Compensation | Attending: Family Medicine | Admitting: Family Medicine

## 2024-09-16 DIAGNOSIS — Z203 Contact with and (suspected) exposure to rabies: Secondary | ICD-10-CM

## 2024-09-16 MED ORDER — RABIES VACCINE, PCEC IM SUSR
INTRAMUSCULAR | Status: AC
  Filled 2024-09-16: qty 1

## 2024-09-16 MED ORDER — RABIES VACCINE, PCEC IM SUSR
1.0000 mL | Freq: Once | INTRAMUSCULAR | Status: AC
  Administered 2024-09-16: 1 mL via INTRAMUSCULAR

## 2024-09-16 NOTE — ED Triage Notes (Signed)
Pt here for 2nd rabies injection

## 2024-09-20 ENCOUNTER — Encounter (HOSPITAL_COMMUNITY): Payer: Self-pay

## 2024-09-20 ENCOUNTER — Ambulatory Visit (HOSPITAL_COMMUNITY)
Admission: RE | Admit: 2024-09-20 | Discharge: 2024-09-20 | Disposition: A | Discharge status: Home / Self Care | Payer: Worker's Compensation | Source: Ambulatory Visit | Attending: Family Medicine | Admitting: Family Medicine

## 2024-09-20 DIAGNOSIS — Z23 Encounter for immunization: Secondary | ICD-10-CM | POA: Diagnosis not present

## 2024-09-20 DIAGNOSIS — Z203 Contact with and (suspected) exposure to rabies: Secondary | ICD-10-CM

## 2024-09-20 MED ORDER — RABIES VIRUS VACCINE, HDC IM SUSR
1.0000 mL | Freq: Once | INTRAMUSCULAR | Status: AC
  Administered 2024-09-20: 1 mL via INTRAMUSCULAR

## 2024-09-20 MED ORDER — RABIES VACCINE, PCEC IM SUSR
INTRAMUSCULAR | Status: AC
  Filled 2024-09-20: qty 1

## 2024-09-20 NOTE — ED Triage Notes (Addendum)
 Patient here today for Day 7 rabies vaccine. Patient is doing well. Patient's last dose of antibiotics is today.

## 2024-09-27 ENCOUNTER — Ambulatory Visit (HOSPITAL_COMMUNITY)
Admission: RE | Admit: 2024-09-27 | Discharge: 2024-09-27 | Disposition: A | Discharge status: Home / Self Care | Payer: Worker's Compensation | Source: Ambulatory Visit | Attending: Family Medicine | Admitting: Family Medicine

## 2024-09-27 DIAGNOSIS — Z203 Contact with and (suspected) exposure to rabies: Secondary | ICD-10-CM

## 2024-09-27 MED ORDER — RABIES VIRUS VACCINE, HDC IM SUSR
1.0000 mL | Freq: Once | INTRAMUSCULAR | Status: AC
  Administered 2024-09-27: 1 mL via INTRAMUSCULAR

## 2024-09-27 MED ORDER — RABIES VACCINE, PCEC IM SUSR
INTRAMUSCULAR | Status: AC
  Filled 2024-09-27: qty 1

## 2024-09-27 NOTE — ED Notes (Signed)
 1 suture removed from rt hand. No redness or drainage noted.

## 2024-09-27 NOTE — ED Triage Notes (Signed)
 Pt here for day 14 rabies vaccine and suture removal. No complaints at this time.
